# Patient Record
Sex: Female | Born: 1980 | Race: White | Hispanic: No | Smoking: Current every day smoker
Health system: Southern US, Community
[De-identification: ages and names within clinical notes are randomized; demographics above are authoritative.]

## PROBLEM LIST (undated history)

## (undated) DIAGNOSIS — F329 Major depressive disorder, single episode, unspecified: Secondary | ICD-10-CM

## (undated) DIAGNOSIS — E039 Hypothyroidism, unspecified: Secondary | ICD-10-CM

## (undated) DIAGNOSIS — R87619 Unspecified abnormal cytological findings in specimens from cervix uteri: Secondary | ICD-10-CM

## (undated) DIAGNOSIS — L609 Nail disorder, unspecified: Secondary | ICD-10-CM

## (undated) DIAGNOSIS — R0600 Dyspnea, unspecified: Secondary | ICD-10-CM

## (undated) DIAGNOSIS — T4145XA Adverse effect of unspecified anesthetic, initial encounter: Secondary | ICD-10-CM

## (undated) DIAGNOSIS — IMO0002 Reserved for concepts with insufficient information to code with codable children: Secondary | ICD-10-CM

## (undated) DIAGNOSIS — T8859XA Other complications of anesthesia, initial encounter: Secondary | ICD-10-CM

## (undated) DIAGNOSIS — F419 Anxiety disorder, unspecified: Secondary | ICD-10-CM

## (undated) DIAGNOSIS — Z8041 Family history of malignant neoplasm of ovary: Secondary | ICD-10-CM

## (undated) DIAGNOSIS — Z1371 Encounter for nonprocreative screening for genetic disease carrier status: Secondary | ICD-10-CM

## (undated) DIAGNOSIS — Z789 Other specified health status: Secondary | ICD-10-CM

## (undated) DIAGNOSIS — N898 Other specified noninflammatory disorders of vagina: Secondary | ICD-10-CM

## (undated) DIAGNOSIS — Z8619 Personal history of other infectious and parasitic diseases: Secondary | ICD-10-CM

## (undated) DIAGNOSIS — I10 Essential (primary) hypertension: Secondary | ICD-10-CM

## (undated) DIAGNOSIS — F32A Depression, unspecified: Secondary | ICD-10-CM

## (undated) DIAGNOSIS — Z9882 Breast implant status: Secondary | ICD-10-CM

## (undated) DIAGNOSIS — M5126 Other intervertebral disc displacement, lumbar region: Secondary | ICD-10-CM

## (undated) HISTORY — DX: Anxiety disorder, unspecified: F41.9

## (undated) HISTORY — DX: Hypothyroidism, unspecified: E03.9

## (undated) HISTORY — DX: Reserved for concepts with insufficient information to code with codable children: IMO0002

## (undated) HISTORY — DX: Breast implant status: Z98.82

## (undated) HISTORY — DX: Nail disorder, unspecified: L60.9

## (undated) HISTORY — DX: Other intervertebral disc displacement, lumbar region: M51.26

## (undated) HISTORY — DX: Major depressive disorder, single episode, unspecified: F32.9

## (undated) HISTORY — PX: COSMETIC SURGERY: SHX468

## (undated) HISTORY — DX: Dyspnea, unspecified: R06.00

## (undated) HISTORY — DX: Essential (primary) hypertension: I10

## (undated) HISTORY — DX: Personal history of other infectious and parasitic diseases: Z86.19

## (undated) HISTORY — DX: Unspecified abnormal cytological findings in specimens from cervix uteri: R87.619

## (undated) HISTORY — DX: Other specified noninflammatory disorders of vagina: N89.8

## (undated) HISTORY — DX: Family history of malignant neoplasm of ovary: Z80.41

---
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
## 2004-01-21 ENCOUNTER — Emergency Department: Payer: Self-pay | Admitting: Unknown Physician Specialty

## 2004-03-09 HISTORY — PX: LEEP: SHX91

## 2007-03-10 DIAGNOSIS — Z9882 Breast implant status: Secondary | ICD-10-CM

## 2007-03-10 HISTORY — PX: BREAST ENHANCEMENT SURGERY: SHX7

## 2007-03-10 HISTORY — DX: Breast implant status: Z98.82

## 2013-06-29 ENCOUNTER — Encounter: Payer: Self-pay | Admitting: Obstetrics & Gynecology

## 2013-06-30 ENCOUNTER — Encounter: Payer: Self-pay | Admitting: *Deleted

## 2013-07-28 ENCOUNTER — Emergency Department: Payer: Self-pay | Admitting: Emergency Medicine

## 2013-08-02 ENCOUNTER — Encounter: Payer: Self-pay | Admitting: General Practice

## 2013-08-02 ENCOUNTER — Ambulatory Visit (INDEPENDENT_AMBULATORY_CARE_PROVIDER_SITE_OTHER): Payer: BC Managed Care – PPO | Admitting: Obstetrics & Gynecology

## 2013-08-02 ENCOUNTER — Encounter: Payer: Self-pay | Admitting: Obstetrics & Gynecology

## 2013-08-02 VITALS — BP 105/73 | HR 78 | Temp 98.4°F | Ht 67.0 in | Wt 158.6 lb

## 2013-08-02 DIAGNOSIS — Z3009 Encounter for other general counseling and advice on contraception: Secondary | ICD-10-CM

## 2013-08-02 NOTE — Progress Notes (Signed)
Subjective:     Patient ID: Carol Castro, female   DOB: 04-Dec-1980, 33 y.o.   MRN: [id number]  HPI 33 yo WF [id number] (EABx5). Divorced/Single.  Presents for request for sterilization.  Pt reports that she will continue to terminate pregnancies without a sterilization.     Past Medical History  Diagnosis Date  . HPV test positive   . Abnormal Pap smear of cervix   . History of breast augmentation 2009   Past Surgical History  Procedure Laterality Date  . Leep  2006  . Cosmetic surgery    . Breast enhancement surgery  2009   History   Social History  . Marital Status: Single    Spouse Name: N/A    Number of Children: N/A  . Years of Education: N/A   Occupational History  . Not on file.   Social History Main Topics  . Smoking status: Current Every Day Smoker -- 0.50 packs/day    Types: Cigarettes  . Smokeless tobacco: Not on file  . Alcohol Use: No  . Drug Use: No  . Sexual Activity: Not Currently    Birth Control/ Protection: Injection   Other Topics Concern  . Not on file   Social History Narrative  . No narrative on file      Review of Systems     Objective:   Physical Exam BP 105/73  Pulse 78  Temp(Src) 98.4 F (36.9 C)  Ht 5\' 7"  (1.702 m)  Wt 158 lb 9.6 oz (71.94 kg)  BMI 24.83 kg/m2  LMP 07/31/2013 Exam deferred     Assessment:     Consult for sterilization- reviewed methods she wants to maintain her fallopian tubes because 'they are part of my body and I think they should be left in'.     Plan:     Patient desires surgical management with bilateral tubal ligation with Filshie clips.  The risks of surgery were discussed in detail with the patient including but not limited to: bleeding which may require transfusion or reoperation; infection which may require prolonged hospitalization or re-hospitalization and antibiotic therapy; injury to bowel, bladder, ureters and major vessels or other surrounding organs; need for additional procedures  including laparotomy; thromboembolic phenomenon, incisional problems and other postoperative or anesthesia complications.  Patient was told that the likelihood that her condition and symptoms will be treated effectively with this surgical management was very high; the postoperative expectations were also discussed in detail. The patient also understands the alternative treatment options which were discussed in full. All questions were answered.  She was told that she will be contacted by our surgical scheduler regarding the time and date of her surgery; routine preoperative instructions of having nothing to eat or drink after midnight on the day prior to surgery and also coming to the hospital 1 1/2 hours prior to her time of surgery were also emphasized.  She was told she may be called for a preoperative appointment about a week prior to surgery and will be given further preoperative instructions at that visit. Printed patient education handouts about the procedure were given to the patient to review at home.

## 2013-08-02 NOTE — Patient Instructions (Signed)
Laparoscopic Tubal Ligation  Laparoscopic tubal ligation is a procedure that closes the fallopian tubes at a time other than right after childbirth. By closing the fallopian tubes, the eggs that are released from the ovaries cannot enter the uterus and sperm cannot reach the egg. Tubal ligation is also known as getting your "tubes tied." Tubal ligation is done so you will not be able to get pregnant or have a baby.   Although this procedure may be reversed, it should be considered permanent and irreversible. If you want to have future pregnancies, you should not have this procedure.   LET YOUR CAREGIVER KNOW ABOUT:  · Allergies to food or medicine.  · Medicines taken, including vitamins, herbs, eyedrops, over-the-counter medicines, and creams.  · Use of steroids (by mouth or creams).  · Previous problems with numbing medicines.  · History of bleeding problems or blood clots.  · Any recent colds or infections.  · Previous surgery.  · Other health problems, including diabetes and kidney problems.  · Possibility of pregnancy, if this applies.  · Any past pregnancies.  RISKS AND COMPLICATIONS   · Infection.  · Bleeding.  · Injury to surrounding organs.  · Anesthetic side effects.  · Failure of the procedure.  · Ectopic pregnancy.  · Future regret about having the procedure done.  BEFORE THE PROCEDURE  · Do not take aspirin or blood thinners a week before the procedure or as directed. This can cause bleeding.  · Do not eat or drink anything 6 to 8 hours before the procedure.  PROCEDURE   · You may be given a medicine to help you relax (sedative) before the procedure. You will be given a medicine to make you sleep (general anesthetic) during the procedure.  · A tube will be put down your throat to help your breath while under general anesthesia.  · Two small cuts (incisions) are made in the lower abdominal area and near the belly button.  · Your abdominal area will be inflated with a safe gas (carbon dioxide). This helps  give the surgeon room to operate, visualize, and helps the surgeon avoid other organs.  · A thin, lighted tube (laparoscope) with a camera attached is inserted into your abdomen through one of the incisions near the belly button. Other small instruments are also inserted through the other abdominal incision.  · The fallopian tubes are located and are either blocked with a ring, clip, or are burned (cauterized).  · After the fallopian tubes are blocked, the gas is released from the abdomen.  · The incisions will be closed with stitches (sutures), and a bandage may be placed over the incisions.  AFTER THE PROCEDURE   · You will rest in a recovery room for 1 4 hours until you are stable and doing well.  · You will also have some mild abdominal discomfort for 3 7 days. You will be given pain medicine to ease any discomfort.  · As long as there are no problems, you may be allowed to go home. Someone will need to drive you home and be with you for at least 24 hours once home.  · You may have some mild discomfort in the throat. This is from the tube placed in your throat while you were sleeping.  · You may experience discomfort in the shoulder area from some trapped air between the liver and diaphragm. This sensation is normal and will slowly go away on its own.  Document Released: 06/01/2000 Document   Revised: 08/25/2011 Document Reviewed: 06/06/2011  ExitCare® Patient Information ©2014 [employer].

## 2013-08-03 ENCOUNTER — Encounter: Payer: Self-pay | Admitting: *Deleted

## 2013-08-23 ENCOUNTER — Encounter (HOSPITAL_COMMUNITY): Payer: Self-pay | Admitting: *Deleted

## 2013-08-28 ENCOUNTER — Ambulatory Visit (HOSPITAL_COMMUNITY): Payer: BC Managed Care – PPO | Admitting: Certified Registered Nurse Anesthetist

## 2013-08-28 ENCOUNTER — Encounter (HOSPITAL_COMMUNITY): Payer: BC Managed Care – PPO | Admitting: Certified Registered Nurse Anesthetist

## 2013-08-28 ENCOUNTER — Encounter (HOSPITAL_COMMUNITY)
Admission: RE | Disposition: A | Discharge status: Home / Self Care | Payer: Self-pay | Source: Ambulatory Visit | Attending: Obstetrics & Gynecology

## 2013-08-28 ENCOUNTER — Ambulatory Visit (HOSPITAL_COMMUNITY)
Admission: RE | Admit: 2013-08-28 | Discharge: 2013-08-28 | Disposition: A | Discharge status: Home / Self Care | Payer: BC Managed Care – PPO | Source: Ambulatory Visit | Attending: Obstetrics & Gynecology | Admitting: Obstetrics & Gynecology

## 2013-08-28 ENCOUNTER — Encounter (HOSPITAL_COMMUNITY): Payer: Self-pay

## 2013-08-28 DIAGNOSIS — F17200 Nicotine dependence, unspecified, uncomplicated: Secondary | ICD-10-CM | POA: Insufficient documentation

## 2013-08-28 DIAGNOSIS — Z302 Encounter for sterilization: Secondary | ICD-10-CM | POA: Diagnosis present

## 2013-08-28 DIAGNOSIS — Z978 Presence of other specified devices: Secondary | ICD-10-CM | POA: Insufficient documentation

## 2013-08-28 DIAGNOSIS — Z8041 Family history of malignant neoplasm of ovary: Secondary | ICD-10-CM | POA: Insufficient documentation

## 2013-08-28 DIAGNOSIS — N838 Other noninflammatory disorders of ovary, fallopian tube and broad ligament: Secondary | ICD-10-CM | POA: Diagnosis not present

## 2013-08-28 HISTORY — DX: Major depressive disorder, single episode, unspecified: F32.9

## 2013-08-28 HISTORY — PX: LAPAROSCOPIC BILATERAL SALPINGECTOMY: SHX5889

## 2013-08-28 HISTORY — DX: Other specified health status: Z78.9

## 2013-08-28 HISTORY — DX: Adverse effect of unspecified anesthetic, initial encounter: T41.45XA

## 2013-08-28 HISTORY — DX: Other complications of anesthesia, initial encounter: T88.59XA

## 2013-08-28 HISTORY — DX: Depression, unspecified: F32.A

## 2013-08-28 LAB — CBC
HCT: 43.2 % (ref 36.0–46.0)
HEMOGLOBIN: 14.3 g/dL (ref 12.0–15.0)
MCH: 29.7 pg (ref 26.0–34.0)
MCHC: 33.1 g/dL (ref 30.0–36.0)
MCV: 89.8 fL (ref 78.0–100.0)
Platelets: 206 10*3/uL (ref 150–400)
RBC: 4.81 MIL/uL (ref 3.87–5.11)
RDW: 13 % (ref 11.5–15.5)
WBC: 6.7 10*3/uL (ref 4.0–10.5)

## 2013-08-28 LAB — PREGNANCY, URINE: PREG TEST UR: NEGATIVE

## 2013-08-28 SURGERY — SALPINGECTOMY, BILATERAL, LAPAROSCOPIC
Anesthesia: General | Site: Abdomen | Laterality: Bilateral

## 2013-08-28 MED ORDER — NEOSTIGMINE METHYLSULFATE 10 MG/10ML IV SOLN
INTRAVENOUS | Status: AC
  Filled 2013-08-28: qty 1

## 2013-08-28 MED ORDER — LACTATED RINGERS IV SOLN
INTRAVENOUS | Status: DC
  Administered 2013-08-28 (×3): via INTRAVENOUS

## 2013-08-28 MED ORDER — ROCURONIUM BROMIDE 100 MG/10ML IV SOLN
INTRAVENOUS | Status: AC
  Filled 2013-08-28: qty 1

## 2013-08-28 MED ORDER — SODIUM CHLORIDE 0.9 % IJ SOLN
INTRAMUSCULAR | Status: AC
  Filled 2013-08-28: qty 10

## 2013-08-28 MED ORDER — KETOROLAC TROMETHAMINE 30 MG/ML IJ SOLN
INTRAMUSCULAR | Status: DC | PRN
  Administered 2013-08-28: 30 mg via INTRAVENOUS

## 2013-08-28 MED ORDER — GLYCOPYRROLATE 0.2 MG/ML IJ SOLN
INTRAMUSCULAR | Status: AC
  Filled 2013-08-28: qty 3

## 2013-08-28 MED ORDER — FENTANYL CITRATE 0.05 MG/ML IJ SOLN
INTRAMUSCULAR | Status: DC | PRN
  Administered 2013-08-28: 50 ug via INTRAVENOUS
  Administered 2013-08-28 (×2): 100 ug via INTRAVENOUS

## 2013-08-28 MED ORDER — DEXAMETHASONE SODIUM PHOSPHATE 10 MG/ML IJ SOLN
INTRAMUSCULAR | Status: AC
  Filled 2013-08-28: qty 1

## 2013-08-28 MED ORDER — LIDOCAINE HCL (CARDIAC) 20 MG/ML IV SOLN
INTRAVENOUS | Status: DC | PRN
  Administered 2013-08-28: 50 mg via INTRAVENOUS

## 2013-08-28 MED ORDER — MIDAZOLAM HCL 2 MG/2ML IJ SOLN
INTRAMUSCULAR | Status: AC
  Filled 2013-08-28: qty 2

## 2013-08-28 MED ORDER — MIDAZOLAM HCL 2 MG/2ML IJ SOLN
INTRAMUSCULAR | Status: DC | PRN
  Administered 2013-08-28: 2 mg via INTRAVENOUS

## 2013-08-28 MED ORDER — FENTANYL CITRATE 0.05 MG/ML IJ SOLN
INTRAMUSCULAR | Status: AC
  Filled 2013-08-28: qty 5

## 2013-08-28 MED ORDER — BUPIVACAINE HCL (PF) 0.5 % IJ SOLN
INTRAMUSCULAR | Status: AC
  Filled 2013-08-28: qty 30

## 2013-08-28 MED ORDER — OXYCODONE-ACETAMINOPHEN 5-325 MG PO TABS
1.0000 | ORAL_TABLET | Freq: Once | ORAL | Status: AC
  Administered 2013-08-28: 1 via ORAL

## 2013-08-28 MED ORDER — NEOSTIGMINE METHYLSULFATE 10 MG/10ML IV SOLN
INTRAVENOUS | Status: DC | PRN
  Administered 2013-08-28: 3 mg via INTRAVENOUS

## 2013-08-28 MED ORDER — ONDANSETRON HCL 4 MG/2ML IJ SOLN
INTRAMUSCULAR | Status: DC | PRN
  Administered 2013-08-28: 4 mg via INTRAVENOUS

## 2013-08-28 MED ORDER — FENTANYL CITRATE 0.05 MG/ML IJ SOLN
INTRAMUSCULAR | Status: AC
  Administered 2013-08-28: 50 ug via INTRAVENOUS
  Filled 2013-08-28: qty 2

## 2013-08-28 MED ORDER — ROCURONIUM BROMIDE 100 MG/10ML IV SOLN
INTRAVENOUS | Status: DC | PRN
  Administered 2013-08-28: 30 mg via INTRAVENOUS

## 2013-08-28 MED ORDER — FENTANYL CITRATE 0.05 MG/ML IJ SOLN
25.0000 ug | INTRAMUSCULAR | Status: DC | PRN
  Administered 2013-08-28 (×2): 50 ug via INTRAVENOUS

## 2013-08-28 MED ORDER — PROPOFOL 10 MG/ML IV EMUL
INTRAVENOUS | Status: AC
  Filled 2013-08-28: qty 20

## 2013-08-28 MED ORDER — MEPERIDINE HCL 25 MG/ML IJ SOLN: 6.2500 mg | INTRAMUSCULAR | Status: DC | PRN

## 2013-08-28 MED ORDER — LIDOCAINE HCL (CARDIAC) 20 MG/ML IV SOLN
INTRAVENOUS | Status: AC
  Filled 2013-08-28: qty 5

## 2013-08-28 MED ORDER — IBUPROFEN 600 MG PO TABS: 600.0000 mg | ORAL_TABLET | Freq: Four times a day (QID) | ORAL | Status: DC | PRN

## 2013-08-28 MED ORDER — PROPOFOL 10 MG/ML IV BOLUS
INTRAVENOUS | Status: DC | PRN
  Administered 2013-08-28: 160 mg via INTRAVENOUS

## 2013-08-28 MED ORDER — DEXAMETHASONE SODIUM PHOSPHATE 10 MG/ML IJ SOLN
INTRAMUSCULAR | Status: DC | PRN
  Administered 2013-08-28: 10 mg via INTRAVENOUS

## 2013-08-28 MED ORDER — OXYCODONE-ACETAMINOPHEN 5-325 MG PO TABS: 1.0000 | ORAL_TABLET | Freq: Four times a day (QID) | ORAL | Status: DC | PRN

## 2013-08-28 MED ORDER — OXYCODONE-ACETAMINOPHEN 5-325 MG PO TABS
ORAL_TABLET | ORAL | Status: AC
  Filled 2013-08-28: qty 1

## 2013-08-28 MED ORDER — MIDAZOLAM HCL 2 MG/2ML IJ SOLN: 0.5000 mg | Freq: Once | INTRAMUSCULAR | Status: DC | PRN

## 2013-08-28 MED ORDER — BUPIVACAINE HCL 0.5 % IJ SOLN
INTRAMUSCULAR | Status: DC | PRN
Start: 2013-08-28 — End: 2013-08-28
  Administered 2013-08-28: 30 mL

## 2013-08-28 MED ORDER — PROMETHAZINE HCL 25 MG/ML IJ SOLN: 6.2500 mg | INTRAMUSCULAR | Status: DC | PRN

## 2013-08-28 MED ORDER — ONDANSETRON HCL 4 MG/2ML IJ SOLN
INTRAMUSCULAR | Status: AC
  Filled 2013-08-28: qty 2

## 2013-08-28 MED ORDER — KETOROLAC TROMETHAMINE 30 MG/ML IJ SOLN
INTRAMUSCULAR | Status: AC
  Filled 2013-08-28: qty 1

## 2013-08-28 MED ORDER — GLYCOPYRROLATE 0.2 MG/ML IJ SOLN
INTRAMUSCULAR | Status: DC | PRN
  Administered 2013-08-28: 0.4 mg via INTRAVENOUS
  Administered 2013-08-28: 0.2 mg via INTRAVENOUS

## 2013-08-28 MED ORDER — KETOROLAC TROMETHAMINE 30 MG/ML IJ SOLN: 15.0000 mg | Freq: Once | INTRAMUSCULAR | Status: DC | PRN

## 2013-08-28 SURGICAL SUPPLY — 25 items
BENZOIN TINCTURE PRP APPL 2/3 (GAUZE/BANDAGES/DRESSINGS) ×3 IMPLANT
CATH ROBINSON RED A/P 16FR (CATHETERS) ×3 IMPLANT
CHLORAPREP W/TINT 26ML (MISCELLANEOUS) ×3 IMPLANT
CLOSURE WOUND 1/4X4 (GAUZE/BANDAGES/DRESSINGS) ×1
CLOTH BEACON ORANGE TIMEOUT ST (SAFETY) ×3 IMPLANT
DERMABOND ADVANCED (GAUZE/BANDAGES/DRESSINGS) ×2
DERMABOND ADVANCED .7 DNX12 (GAUZE/BANDAGES/DRESSINGS) ×1 IMPLANT
DRSG COVADERM PLUS 2X2 (GAUZE/BANDAGES/DRESSINGS) ×3 IMPLANT
DRSG OPSITE POSTOP 3X4 (GAUZE/BANDAGES/DRESSINGS) ×3 IMPLANT
GLOVE BIO SURGEON STRL SZ7 (GLOVE) ×3 IMPLANT
GLOVE BIOGEL PI IND STRL 7.0 (GLOVE) ×1 IMPLANT
GLOVE BIOGEL PI INDICATOR 7.0 (GLOVE) ×2
GOWN STRL REUS W/TWL LRG LVL3 (GOWN DISPOSABLE) ×6 IMPLANT
MANIPULATOR UTERINE 4.5 ZUMI (MISCELLANEOUS) ×3 IMPLANT
NEEDLE INSUFFLATION 120MM (ENDOMECHANICALS) ×3 IMPLANT
PACK LAPAROSCOPY BASIN (CUSTOM PROCEDURE TRAY) ×3 IMPLANT
STRIP CLOSURE SKIN 1/4X4 (GAUZE/BANDAGES/DRESSINGS) ×2 IMPLANT
SUT VIC AB 3-0 PS2 18 (SUTURE) ×3 IMPLANT
SUT VICRYL 0 UR6 27IN ABS (SUTURE) ×3 IMPLANT
TOWEL OR 17X24 6PK STRL BLUE (TOWEL DISPOSABLE) ×6 IMPLANT
TROCAR OPTI TIP 5M 100M (ENDOMECHANICALS) ×3 IMPLANT
TROCAR XCEL NON BLADE 8MM B8LT (ENDOMECHANICALS) ×3 IMPLANT
TROCAR XCEL OPT SLVE 5M 100M (ENDOMECHANICALS) ×3 IMPLANT
WARMER LAPAROSCOPE (MISCELLANEOUS) ×3 IMPLANT
WATER STERILE IRR 1000ML POUR (IV SOLUTION) ×3 IMPLANT

## 2013-08-28 NOTE — Transfer of Care (Signed)
Immediate Anesthesia Transfer of Care Note  Patient: Carol DuffHeather E Sharrar  Procedure(s) Performed: Procedure(s): LAPAROSCOPIC BILATERAL SALPINGECTOMY (Bilateral)  Patient Location: PACU  Anesthesia Type:General  Level of Consciousness: awake, alert  and oriented  Airway & Oxygen Therapy: Patient Spontanous Breathing and Patient connected to nasal cannula oxygen  Post-op Assessment: Report given to PACU RN, Post -op Vital signs reviewed and stable and Patient moving all extremities X 4  Post vital signs: Reviewed and stable  Complications: No apparent anesthesia complications

## 2013-08-28 NOTE — Anesthesia Postprocedure Evaluation (Signed)
  Anesthesia Post Note  Patient: Carol DuffHeather E Lagace  Procedure(s) Performed: Procedure(s) (LRB): LAPAROSCOPIC BILATERAL SALPINGECTOMY (Bilateral)  Anesthesia type: GA  Patient location: PACU  Post pain: Pain level controlled  Post assessment: Post-op Vital signs reviewed  Last Vitals:  Filed Vitals:   08/28/13 1115  BP:   Temp: 36.6 C  Resp: 16    Post vital signs: Reviewed  Level of consciousness: sedated  Complications: No apparent anesthesia complications

## 2013-08-28 NOTE — Discharge Instructions (Signed)
Salpingectomy, Care After °Refer to this sheet in the next few weeks. These instructions provide you with information on caring for yourself after your procedure. Your health care provider may also give you more specific instructions. Your treatment has been planned according to current medical practices, but problems sometimes occur. Call your health care provider if you have any problems or questions after your procedure. °WHAT TO EXPECT AFTER THE PROCEDURE °After your procedure, it is typical to have the following: °· Abdominal pain that can be controlled with pain medicine. °· Vaginal spotting. °· Tiredness. °HOME CARE INSTRUCTIONS °· Get plenty of rest and sleep. °· Only take over-the-counter or prescription medicines as directed by your health care provider. °· Keep incision areas clean and dry. Remove or change any bandages (dressings) only as directed by your health care provider. °· You may resume your regular diet. Eat a well-balanced diet. °· Drink enough fluids to keep your urine clear or pale yellow. °· Limit exercise and activities as directed by your health care provider. Do not lift anything heavier than 5 lb (2.3 kg) until your health care provider approves. °· Do not drive until your health care provider approves. °· Do not have sexual intercourse until your health care provider says it is okay. °· Take your temperature twice a day for the first week. Write those temperatures down. °· Follow up with your health care provider as directed. °SEEK MEDICAL CARE IF: °· You have pain when you urinate. °· You see pus coming out of the incision, or the incision is separating. °· You have increasing abdominal pain. °· You have swelling or redness in the incision area. °· You develop a rash. °· You feel lightheaded. °· You have pain that is not controlled with medicine. °SEEK IMMEDIATE MEDICAL CARE IF: °· You develop a fever. °· You have increasing abdominal pain. °· You develop chest or leg pain. °· You  develop shortness of breath. °· You pass out. °Document Released: 05/30/2010 Document Revised: 12/14/2012 Document Reviewed: 08/17/2012 °ExitCare® Patient Information ©2015 [employer]. This information is not intended to replace advice given to you by your health care provider. Make sure you discuss any questions you have with your health care provider. ° °

## 2013-08-28 NOTE — H&P (Signed)
  HPI 33 yo WF [id number] (EABx5). Divorced/Single. Presents for request for sterilization. Pt reports that she will continue to terminate pregnancies without a sterilization.  Past Medical History   Diagnosis  Date   .  HPV test positive    .  Abnormal Pap smear of cervix    .  History of breast augmentation  2009    Past Surgical History   Procedure  Laterality  Date   .  Leep   2006   .  Cosmetic surgery     .  Breast enhancement surgery   2009    History    Social History   .  Marital Status:  Single     Spouse Name:  N/A     Number of Children:  N/A   .  Years of Education:  N/A    Occupational History   .  Not on file.    Social History Main Topics   .  Smoking status:  Current Every Day Smoker -- 0.50 packs/day     Types:  Cigarettes   .  Smokeless tobacco:  Not on file   .  Alcohol Use:  No   .  Drug Use:  No   .  Sexual Activity:  Not Currently     Birth Control/ Protection:  Injection    Other Topics  Concern   .  Not on file    Social History Narrative   .  No narrative on file   Review of Systems  Objective:   Physical Exam BP 98/72  Temp(Src) 98.4 F (36.9 C) (Oral)  Resp 18  Ht 5\' 7"  (1.702 m)  Wt 160 lb (72.576 kg)  BMI 25.05 kg/m2  SpO2 98%  LMP 07/31/2013  Exam deferred  Assessment:   Pt presents for bilateral salpingectomy for sterilization.  After reviewing the info of an aunt with ovarian cancer she desires a salpingectomy.  Her mother was present for the discussion.  Plan:   Patient desires surgical management with bilateral tubal ligation with Filshie clips. The risks of surgery were discussed in detail with the patient including but not limited to: bleeding which may require transfusion or reoperation; infection which may require prolonged hospitalization or re-hospitalization and antibiotic therapy; injury to bowel, bladder, ureters and major vessels or other surrounding organs; need for additional procedures including laparotomy;  thromboembolic phenomenon, incisional problems and other postoperative or anesthesia complications. Patient was told that the likelihood that her condition and symptoms will be treated effectively with this surgical management was very high; the postoperative expectations were also discussed in detail. The patient also understands the alternative treatment options which were discussed in full. All questions were answered.

## 2013-08-28 NOTE — Anesthesia Preprocedure Evaluation (Addendum)
Anesthesia Evaluation  Patient identified by MRN, date of birth, ID band Patient awake    Reviewed: Allergy & Precautions, H&P , Patient's Chart, lab work & pertinent test results, reviewed documented beta blocker date and time   History of Anesthesia Complications Negative for: history of anesthetic complications  Airway Mallampati: II  TM Distance: >3 FB Neck ROM: full    Dental   Pulmonary Current Smoker,  breath sounds clear to auscultation        Cardiovascular Exercise Tolerance: Good Rhythm:regular Rate:Normal     Neuro/Psych    GI/Hepatic   Endo/Other    Renal/GU      Musculoskeletal   Abdominal   Peds  Hematology   Anesthesia Other Findings   Reproductive/Obstetrics                             Anesthesia Physical Anesthesia Plan  ASA: II  Anesthesia Plan: General ETT   Post-op Pain Management:    Induction:   Airway Management Planned:   Additional Equipment:   Intra-op Plan:   Post-operative Plan:   Informed Consent: I have reviewed the patients History and Physical, chart, labs and discussed the procedure including the risks, benefits and alternatives for the proposed anesthesia with the patient or authorized representative who has indicated his/her understanding and acceptance.   Dental Advisory Given  Plan Discussed with: CRNA and Surgeon  Anesthesia Plan Comments:         Anesthesia Quick Evaluation  

## 2013-08-28 NOTE — Brief Op Note (Signed)
08/28/2013  11:01 AM  PATIENT:  Carol Castro  33 y.o. female  PRE-OPERATIVE DIAGNOSIS:  Undesired Fertility  POST-OPERATIVE DIAGNOSIS:  Undesired Fertility  PROCEDURE:  Procedure(s): LAPAROSCOPIC BILATERAL SALPINGECTOMY (Bilateral)  SURGEON:  Surgeon(s) and Role:    * Willodean Rosenthalarolyn Harraway-Smith, MD - Primary  ANESTHESIA:   general  EBL:  Total I/O In: 1000 [I.V.:1000] Out: 100 [Urine:100]  BLOOD ADMINISTERED:none  DRAINS: none   LOCAL MEDICATIONS USED:  MARCAINE     SPECIMEN:  Source of Specimen:  fallopian tubes bilaterally  DISPOSITION OF SPECIMEN:  PATHOLOGY  COUNTS:  YES  TOURNIQUET:  * No tourniquets in log *  DICTATION: .Note written in EPIC  PLAN OF CARE: Discharge to home after PACU  PATIENT DISPOSITION:  PACU - hemodynamically stable.   Delay start of Pharmacological VTE agent (>24hrs) due to surgical blood loss or risk of bleeding: not applicable

## 2013-08-28 NOTE — Op Note (Signed)
08/28/2013  11:01 AM  PATIENT:  Carol Castro  33 y.o. female  PRE-OPERATIVE DIAGNOSIS:  Undesired Fertility  POST-OPERATIVE DIAGNOSIS:  Undesired Fertility  PROCEDURE:  Procedure(s): LAPAROSCOPIC BILATERAL SALPINGECTOMY (Bilateral)  SURGEON:  Surgeon(s) and Role:    * Willodean Rosenthalarolyn Harraway-Smith, MD - Primary  ANESTHESIA:   general  EBL:  Total I/O In: 1000 [I.V.:1000] Out: 100 [Urine:100]  BLOOD ADMINISTERED:none  DRAINS: none   LOCAL MEDICATIONS USED:  MARCAINE     SPECIMEN:  Source of Specimen:  fallopian tubes bilaterally  DISPOSITION OF SPECIMEN:  PATHOLOGY  COUNTS:  YES  TOURNIQUET:  * No tourniquets in log *  DICTATION: .Note written in EPIC  PLAN OF CARE: Discharge to home after PACU  PATIENT DISPOSITION:  PACU - hemodynamically stable.   Delay start of Pharmacological VTE agent (>24hrs) due to surgical blood loss or risk of bleeding: not applicable    IINDICATIONS: 33 y.o. [id number] with undesired fertility, desires permanent sterilization. Other reversible forms of contraception were discussed with patient; she declines all other modalities.  Risks of procedure discussed with patient including permanence of method, risk of regret, bleeding, infection, injury to surrounding organs and need for additional procedures including laparotomy.  Failure risk less than 0.5% with increased risk of ectopic gestation if pregnancy occurs was also discussed with patient.  Written informed consent was obtained.    FINDINGS:  Normal uterus, fallopian tubes, and ovaries.  TECHNIQUE:  The patient was taken to the operating room where general anesthesia was obtained without difficulty.  She was then placed in the dorsal lithotomy position and prepared and draped in sterile fashion.  After an adequate timeout was performed, a bivalved speculum was then placed in the patient's vagina, and the anterior lip of cervix grasped with the single-tooth tenaculum.  The uterine manipulator  was then advanced into the uterus.  The speculum was removed from the vagina.  Attention was then turned to the patient's abdomen where a 5-mm skin incision was made in the umbilical fold.  The 5-mm trocar and sleeve were then advanced without difficulty with the laparoscope under direct visualization into the abdomen.  The abdomen was then insufflated with carbon dioxide gas.  Adequate pneumoperitoneum was obtained.  A survey of the patient's pelvis and abdomen revealed the findings above.  Bilateral 5-mm lower quadrant ports  were then placed under direct visualization.  The fallopian tubes were transected from the uterine attachments and the underlying mesosalpinx with the Harmonic device allowing for bilateral salpingectomy.  The fallopian tubes were then removed from the abdomen under direct visualization.  The operative site was surveyed, and it was found to be hemostatic.   No intraoperative injury to other surrounding organs was noted.  The abdomen was desufflated and all instruments were then removed from the patient's abdomen.  All skin incisions were closed with Dermabond and benzoin and steristrips. They were also covered with steristrips and benzoin .  The uterine manipulator was removed from the vagina without complications. The patient tolerated the procedure well.  Sponge, lap, and needle counts were correct times two.  The patient was then taken to the recovery room awake, extubated and in stable condition.  The patient will be discharged to home as per PACU criteria.  Routine postoperative instructions given.  She was prescribed Percocet and Motrin.  She will follow up in the clinic in 2 weeks for postoperative evaluation.

## 2013-08-29 ENCOUNTER — Encounter (HOSPITAL_COMMUNITY): Payer: Self-pay | Admitting: Obstetrics & Gynecology

## 2013-09-06 ENCOUNTER — Encounter (HOSPITAL_BASED_OUTPATIENT_CLINIC_OR_DEPARTMENT_OTHER): Admission: RE | Payer: Self-pay | Source: Ambulatory Visit

## 2013-09-06 ENCOUNTER — Ambulatory Visit (HOSPITAL_BASED_OUTPATIENT_CLINIC_OR_DEPARTMENT_OTHER)
Admission: RE | Admit: 2013-09-06 | Payer: Medicaid Other | Source: Ambulatory Visit | Admitting: Obstetrics & Gynecology

## 2013-09-06 SURGERY — LIGATION, FALLOPIAN TUBE, BILATERAL
Anesthesia: Choice

## 2013-10-16 ENCOUNTER — Encounter (HOSPITAL_COMMUNITY): Admission: RE | Payer: Self-pay | Source: Ambulatory Visit

## 2013-10-16 ENCOUNTER — Ambulatory Visit (HOSPITAL_COMMUNITY)
Admission: RE | Admit: 2013-10-16 | Payer: Medicaid Other | Source: Ambulatory Visit | Admitting: Obstetrics & Gynecology

## 2013-10-16 SURGERY — LIGATION, FALLOPIAN TUBE, LAPAROSCOPIC
Anesthesia: Choice | Site: Abdomen | Laterality: Bilateral

## 2013-12-05 ENCOUNTER — Telehealth: Payer: Self-pay | Admitting: *Deleted

## 2013-12-05 NOTE — Telephone Encounter (Signed)
2nd attempt to reschedule 10/1 office visit. Patient was called on 9/28 and again 9/29.

## 2013-12-07 ENCOUNTER — Ambulatory Visit: Payer: BC Managed Care – PPO | Admitting: Neurology

## 2014-01-08 ENCOUNTER — Encounter (HOSPITAL_COMMUNITY): Payer: Self-pay | Admitting: Obstetrics & Gynecology

## 2014-02-16 DIAGNOSIS — E039 Hypothyroidism, unspecified: Secondary | ICD-10-CM | POA: Insufficient documentation

## 2014-02-16 DIAGNOSIS — I10 Essential (primary) hypertension: Secondary | ICD-10-CM | POA: Insufficient documentation

## 2014-02-16 DIAGNOSIS — E119 Type 2 diabetes mellitus without complications: Secondary | ICD-10-CM | POA: Insufficient documentation

## 2014-02-16 HISTORY — DX: Essential (primary) hypertension: I10

## 2014-02-16 HISTORY — DX: Hypothyroidism, unspecified: E03.9

## 2014-06-05 ENCOUNTER — Ambulatory Visit: Payer: Self-pay | Admitting: Obstetrics and Gynecology

## 2014-06-12 ENCOUNTER — Ambulatory Visit
Admit: 2014-06-12 | Disposition: A | Discharge status: Home / Self Care | Payer: Self-pay | Attending: Obstetrics and Gynecology | Admitting: Obstetrics and Gynecology

## 2014-07-08 NOTE — Op Note (Signed)
PATIENT NAME:  Carol Castro, Carol Castro MR#:  [id number] DATE OF BIRTH:  [birth year]  DATE OF PROCEDURE:  06/12/2014  ATTENDING/DICTATING:  [place] Bingharlie Alante Tolan, MD  PREOPERATIVE DIAGNOSIS:  Chronic pelvic pain.   POSTOPERATIVE DIAGNOSIS:  Chronic pelvic pain.   PROCEDURE: Diagnostic laparoscopy.   SURGEON: [place] Bingharlie Alaa Mullally, MD   ASSISTANT: None.   ANESTHESIA: General and local.   ESTIMATED BLOOD LOSS: 10 mL.   FLUIDS: 700 mL crystalloid.   URINE OUTPUT: 50 mL via in and out catheter.   ANTIBIOTICS: None indicated.   PTE THROMBOEMBOLISM PROPHYLAXIS: SCDs applied to bilateral lower extremities.   SPECIMENS: None.   COMPLICATIONS: None.   DISPOSITION: To PACU.   FINDINGS: Exam under anesthesia revealed a retroverted, mobile, small uterus, and no adnexal masses palpated. The uterus sounded to 11 cm.  On diagnostic laparoscopy, normal liver edge,  stomach edge, omentum. No adhesions were noted anywhere in the abdomen or the pelvis and  normal-appearing appendix. Normal uterus, and ovaries bilaterally with no bilateral fallopian tubes from prior surgeries.   In the bilateral paraovarian fossas as well as the anterior and posterior cul-de-sacs, there were no scar tissue signs of endometriosis scarring or adhesions.   DESCRIPTION OF PROCEDURE: The patient was taken to the operating room where the anesthesia was administered. She was then prepped and draped in normal sterile fashion in  dorsal lithotomy position in [place] stirrups.   Next, her bladder was then drained via a red rubber catheter and a speculum was then inserted and the cervix visualized and grasped on the anterior lip by single-tooth tenaculum and sounded to 11 cm and a Hulka clamp was then placed and the tenaculum and speculum were then removed. Next, gloves were then changed and attention was then turned to the patient's abdomen where the umbilicus was then infiltrated with local anesthesia and a 12 mm balloon trocar was then placed  using opened entry technique. The laparoscope was then introduced, and the abdomen insufflated with the above-noted findings. Next, a 5 mm suprapubic port was then placed under direct visualization for better manipulation with the above noted findings. Next, the 5 mm port was then removed under direct visualization and the balloon trocar and laparoscope were removed from the umbilicus. The umbilical fascia was then closed with 0 Vicryl in a running fashion and the skin incision closed with 4-0 Monocryl and the skin incision closed at the suprapubic port closed with Dermabond with Dermabond also placed over the umbilical port. The Hulka was then removed from the patient's cervix and the speculum reintroduced with excellent  hemostasis noted from all operative sites. Sponge, lap, needle, and instrument counts were correct x2 and the patient was taken to the recovery room, awake, alert, and breathing independently in stable condition.     ____________________________ [place] Bingharlie Yamilet Mcfayden, MD cp:tr D: 06/12/2014 10:42:03 ET T: 06/12/2014 11:12:00 ET JOB#: [id number]  cc: [place] Bingharlie Vernel Langenderfer, MD, <Dictator> [place] BingHARLIE Javae Braaten MD ELECTRONICALLY SIGNED 06/21/2014 10:22

## 2014-07-27 ENCOUNTER — Encounter (INDEPENDENT_AMBULATORY_CARE_PROVIDER_SITE_OTHER): Payer: Self-pay

## 2014-07-27 ENCOUNTER — Encounter: Payer: Self-pay | Admitting: Primary Care

## 2014-07-27 ENCOUNTER — Ambulatory Visit (INDEPENDENT_AMBULATORY_CARE_PROVIDER_SITE_OTHER): Payer: 59 | Admitting: Primary Care

## 2014-07-27 VITALS — BP 114/78 | HR 77 | Temp 98.3°F | Ht 67.0 in | Wt 171.2 lb

## 2014-07-27 DIAGNOSIS — F329 Major depressive disorder, single episode, unspecified: Secondary | ICD-10-CM

## 2014-07-27 DIAGNOSIS — R51 Headache: Secondary | ICD-10-CM | POA: Diagnosis not present

## 2014-07-27 DIAGNOSIS — F418 Other specified anxiety disorders: Secondary | ICD-10-CM | POA: Diagnosis not present

## 2014-07-27 DIAGNOSIS — J329 Chronic sinusitis, unspecified: Secondary | ICD-10-CM | POA: Diagnosis not present

## 2014-07-27 DIAGNOSIS — R519 Headache, unspecified: Secondary | ICD-10-CM

## 2014-07-27 DIAGNOSIS — L700 Acne vulgaris: Secondary | ICD-10-CM | POA: Diagnosis not present

## 2014-07-27 DIAGNOSIS — R635 Abnormal weight gain: Secondary | ICD-10-CM

## 2014-07-27 DIAGNOSIS — F419 Anxiety disorder, unspecified: Principal | ICD-10-CM

## 2014-07-27 DIAGNOSIS — K589 Irritable bowel syndrome without diarrhea: Secondary | ICD-10-CM

## 2014-07-27 DIAGNOSIS — F32A Depression, unspecified: Secondary | ICD-10-CM

## 2014-07-27 MED ORDER — FLUTICASONE PROPIONATE 50 MCG/ACT NA SUSP: 2.0000 | Freq: Every day | NASAL | Status: DC

## 2014-07-27 MED ORDER — FLUOXETINE HCL 20 MG PO TABS: 20.0000 mg | ORAL_TABLET | Freq: Every day | ORAL | Status: DC

## 2014-07-27 MED ORDER — BUPROPION HCL ER (SR) 150 MG PO TB12: 150.0000 mg | ORAL_TABLET | Freq: Two times a day (BID) | ORAL | Status: DC

## 2014-07-27 MED ORDER — AMOXICILLIN-POT CLAVULANATE 875-125 MG PO TABS: 1.0000 | ORAL_TABLET | Freq: Two times a day (BID) | ORAL | Status: DC

## 2014-07-27 NOTE — Progress Notes (Signed)
Pre visit review using our clinic review tool, if applicable. No additional management support is needed unless otherwise documented below in the visit note. 

## 2014-07-27 NOTE — Progress Notes (Signed)
Subjective:    Patient ID: Carol Castro, female    DOB: 1980/10/18, 34 y.o.   MRN: [id number]  HPI  [name] is a 34 year old female who presents today to establish care and discuss the problems mentioned below. Will obtain old records.  1) Anxiety and Depression: Diagnosed in her early 20's. She's been taking Wellbutrin SR 150 mg and Fluoxitine 20 mg daily since. She is out of medication for both and is needing refills. She feels well managed on these medications. She has once tried to wean off of these medications but was not successful. Denies SI/HI, panic attacks.   2) Frequent headaches: Present for years, once had migraines. Present to bilateral temporal region that is present every other day. She will typically take excedrin or caffiene with some relief. Headaches are tolerable at this point.  3) IBS Mixed type: Provided with Diclomine QID with food which did not provide relief. She will get stomach cramps once-twice weekly. Denies bloody stools, nausea, vomiting.  4) Sinus pressure: Present for 2-3 weeks with pressure behind both eyes without improvement. She also reports nasal congestion, cough, ear pressure that has worsened over past week. Taking daily Allegra daily for 2 weeks and dayquil. She has not taken any ibuprofen or tylenol.   5) Weight gain: Has had full work up by prior PCP. She has gained 30 pounds in the past 2 years. She took Phentermine 37.5 for 3 months without much loss. She had work up for thyroid, A1C, allergy testing which were all normal per patient.  Weight: 171 lb 3.2 oz (77.656 kg)    Review of Systems  Constitutional: Negative for fever, chills and unexpected weight change.  HENT: Positive for congestion and sneezing. Negative for sore throat.        Ear pressure.  Respiratory: Negative for shortness of breath.   Cardiovascular: Negative for chest pain.  Gastrointestinal: Negative for nausea.       History of IBS, mixed type.    Genitourinary: Positive for frequency. Negative for dysuria.  Musculoskeletal: Negative for myalgias and arthralgias.  Skin: Negative for rash.  Neurological: Positive for headaches. Negative for dizziness.  Psychiatric/Behavioral:       See HPI.       Past Medical History  Diagnosis Date  . HPV test positive   . Abnormal Pap smear of cervix   . History of breast augmentation 2009  . Medical history non-contributory   . Depression   . Complication of anesthesia     History   Social History  . Marital Status: Divorced    Spouse Name: N/A  . Number of Children: N/A  . Years of Education: N/A   Occupational History  . Not on file.   Social History Main Topics  . Smoking status: Current Every Day Smoker -- 0.50 packs/day    Types: Cigarettes  . Smokeless tobacco: Not on file  . Alcohol Use: No  . Drug Use: No  . Sexual Activity: Not Currently    Birth Control/ Protection: Injection   Other Topics Concern  . Not on file   Social History Narrative    Past Surgical History  Procedure Laterality Date  . Leep  2006  . Cosmetic surgery    . Breast enhancement surgery  2009  . Laparoscopic bilateral salpingectomy Bilateral 08/28/2013    Procedure: LAPAROSCOPIC BILATERAL SALPINGECTOMY;  Surgeon: Willodean Rosenthalarolyn Harraway-Smith, MD;  Location: [facility];  Service: Gynecology;  Laterality: Bilateral;    Family History  Problem Relation Age of Onset  . Alcohol abuse Father   . Cancer Maternal Aunt   . Cancer Maternal Grandfather     No Known Allergies  No current outpatient prescriptions on file prior to visit.   No current facility-administered medications on file prior to visit.    BP 114/78 mmHg  Pulse 77  Temp(Src) 98.3 F (36.8 C) (Oral)  Ht 5\' 7"  (1.702 m)  Wt 171 lb 3.2 oz (77.656 kg)  BMI 26.81 kg/m2  SpO2 98%  LMP 07/27/2014    Objective:   Physical Exam  Constitutional: She is oriented to person, place, and time. She appears ill.  HENT:  Right Ear:  Tympanic membrane and ear canal normal.  Left Ear: Tympanic membrane and ear canal normal.  Nose: Right sinus exhibits maxillary sinus tenderness and frontal sinus tenderness. Left sinus exhibits maxillary sinus tenderness and frontal sinus tenderness.  Mouth/Throat: Oropharynx is clear and moist.  Eyes: Conjunctivae and EOM are normal. Pupils are equal, round, and reactive to light.  Neck: Neck supple. No thyromegaly present.  Cardiovascular: Normal rate and regular rhythm.   Pulmonary/Chest: Effort normal and breath sounds normal.  Abdominal: Soft. Bowel sounds are normal.  Lymphadenopathy:    She has no cervical adenopathy.  Neurological: She is alert and oriented to person, place, and time. No cranial nerve deficit.  Skin: Skin is warm and dry.  Psychiatric: She has a normal mood and affect.          Assessment & Plan:  Sinusitis:  Frontal and maxillary sinus pressure present for 2-3 weeks with nasal congestion, ear pain and cough over past week. Taking daily Allegra. Sinus cavities tender on exam. RX for 10 day course of Augmentin. Flonase for nasal congestion. Follow up if no improvement in 3-4 days.

## 2014-07-27 NOTE — Patient Instructions (Signed)
Start Augmentin antibiotics for sinusitis. Take 1 tablet by mouth twice daily for 10 days. You may use Flonase nasal spray for nasal congestion. You will be contacted regarding your referral to Dermatology.  Please let us know if you have not heard back within one week.  Refills have been provided for your prozac and Wellbutrin. Please schedule a physical with me in the next 2 months. You will also schedule a lab only appointment one week prior. We will discuss your lab results during your physical. It was a pleasure to meet you today! Please don't hesitate to call me with any questions. Welcome to [facility]!  Sinusitis Sinusitis is redness, soreness, and inflammation of the paranasal sinuses. Paranasal sinuses are air pockets within the bones of your face (beneath the eyes, the middle of the forehead, or above the eyes). In healthy paranasal sinuses, mucus is able to drain out, and air is able to circulate through them by way of your nose. However, when your paranasal sinuses are inflamed, mucus and air can become trapped. This can allow bacteria and other germs to grow and cause infection. Sinusitis can develop quickly and last only a short time (acute) or continue over a long period (chronic). Sinusitis that lasts for more than 12 weeks is considered chronic.  CAUSES  Causes of sinusitis include:  Allergies.  Structural abnormalities, such as displacement of the cartilage that separates your nostrils (deviated septum), which can decrease the air flow through your nose and sinuses and affect sinus drainage.  Functional abnormalities, such as when the small hairs (cilia) that line your sinuses and help remove mucus do not work properly or are not present. SIGNS AND SYMPTOMS  Symptoms of acute and chronic sinusitis are the same. The primary symptoms are pain and pressure around the affected sinuses. Other symptoms include:  Upper toothache.  Earache.  Headache.  Bad breath.  Decreased  sense of smell and taste.  A cough, which worsens when you are lying flat.  Fatigue.  Fever.  Thick drainage from your nose, which often is green and may contain pus (purulent).  Swelling and warmth over the affected sinuses. DIAGNOSIS  Your health care provider will perform a physical exam. During the exam, your health care provider may:  Look in your nose for signs of abnormal growths in your nostrils (nasal polyps).  Tap over the affected sinus to check for signs of infection.  View the inside of your sinuses (endoscopy) using an imaging device that has a light attached (endoscope). If your health care provider suspects that you have chronic sinusitis, one or more of the following tests may be recommended:  Allergy tests.  Nasal culture. A sample of mucus is taken from your nose, sent to a lab, and screened for bacteria.  Nasal cytology. A sample of mucus is taken from your nose and examined by your health care provider to determine if your sinusitis is related to an allergy. TREATMENT  Most cases of acute sinusitis are related to a viral infection and will resolve on their own within 10 days. Sometimes medicines are prescribed to help relieve symptoms (pain medicine, decongestants, nasal steroid sprays, or saline sprays).  However, for sinusitis related to a bacterial infection, your health care provider will prescribe antibiotic medicines. These are medicines that will help kill the bacteria causing the infection.  Rarely, sinusitis is caused by a fungal infection. In theses cases, your health care provider will prescribe antifungal medicine. For some cases of chronic sinusitis, surgery is  needed. Generally, these are cases in which sinusitis recurs more than 3 times per year, despite other treatments. HOME CARE INSTRUCTIONS   Drink plenty of water. Water helps thin the mucus so your sinuses can drain more easily.  Use a humidifier.  Inhale steam 3 to 4 times a day (for  example, sit in the bathroom with the shower running).  Apply a warm, moist washcloth to your face 3 to 4 times a day, or as directed by your health care provider.  Use saline nasal sprays to help moisten and clean your sinuses.  Take medicines only as directed by your health care provider.  If you were prescribed either an antibiotic or antifungal medicine, finish it all even if you start to feel better. SEEK IMMEDIATE MEDICAL CARE IF:  You have increasing pain or severe headaches.  You have nausea, vomiting, or drowsiness.  You have swelling around your face.  You have vision problems.  You have a stiff neck.  You have difficulty breathing. MAKE SURE YOU:   Understand these instructions.  Will watch your condition.  Will get help right away if you are not doing well or get worse. Document Released: 02/23/2005 Document Revised: 07/10/2013 Document Reviewed: 03/10/2011 [facility] Patient Information 2015 [place]. This information is not intended to replace advice given to you by your health care provider. Make sure you discuss any questions you have with your health care provider.

## 2014-07-29 DIAGNOSIS — R51 Headache: Secondary | ICD-10-CM

## 2014-07-29 DIAGNOSIS — K589 Irritable bowel syndrome without diarrhea: Secondary | ICD-10-CM | POA: Insufficient documentation

## 2014-07-29 DIAGNOSIS — L700 Acne vulgaris: Secondary | ICD-10-CM | POA: Insufficient documentation

## 2014-07-29 DIAGNOSIS — R635 Abnormal weight gain: Secondary | ICD-10-CM | POA: Insufficient documentation

## 2014-07-29 DIAGNOSIS — R519 Headache, unspecified: Secondary | ICD-10-CM | POA: Insufficient documentation

## 2014-07-29 DIAGNOSIS — F419 Anxiety disorder, unspecified: Principal | ICD-10-CM

## 2014-07-29 DIAGNOSIS — F329 Major depressive disorder, single episode, unspecified: Secondary | ICD-10-CM | POA: Insufficient documentation

## 2014-07-29 DIAGNOSIS — F32A Depression, unspecified: Secondary | ICD-10-CM | POA: Insufficient documentation

## 2014-07-29 HISTORY — DX: Anxiety disorder, unspecified: F41.9

## 2014-07-29 HISTORY — DX: Depression, unspecified: F32.A

## 2014-07-29 NOTE — Assessment & Plan Note (Addendum)
Mixed constipation and diarrhea types. Abdominal cramping present 1-2 times weekly. Once tried on Bentyl without relief or change in cramps. No bloody stools, nausea, vomiting. Abdomen non tender, soft, BS NL. Will re-address during physical in July

## 2014-07-29 NOTE — Assessment & Plan Note (Signed)
Longstanding history of facial acne. Would like referral for Dermatologist to discuss Accutane. Referral provided.

## 2014-07-29 NOTE — Assessment & Plan Note (Signed)
Present since early 20's. Managed on Fluoxitine 20 mg and Wellbutrin SR for which she would like refills due to being completely out. Feels well managed on this medication. Denies SI/HI and panic attacks. Will provide refills today and continue to monitor.

## 2014-07-29 NOTE — Assessment & Plan Note (Signed)
Reports steady gain over the years despite healthy diet and exercise. Full workup provided by prior PCP which was unremarkable per patient. Will discuss further at upcoming physical.  Body mass index is 26.81 kg/(m^2).

## 2014-07-29 NOTE — Assessment & Plan Note (Signed)
Present for years. Occurs every 2 days. Once had frequent migraines, denies now. Manages with Excedrin or caffeine consumption. Headaches tolerable.

## 2014-09-24 ENCOUNTER — Other Ambulatory Visit: Payer: 59

## 2014-09-27 ENCOUNTER — Encounter: Payer: 59 | Admitting: Primary Care

## 2014-09-28 ENCOUNTER — Telehealth: Payer: Self-pay | Admitting: *Deleted

## 2014-09-28 NOTE — Telephone Encounter (Signed)
Pt left voicemail at Triage. Pt is trying to quit smoking and is requesting a Rx for chantix, pt uses [facility]

## 2014-09-28 NOTE — Telephone Encounter (Signed)
Please notify Carol Castro that I am happy to help her with smoking cessation. She will need to schedule a follow up appointment with me. It appears that I saw her in May and wanted to see her in July for physical. Please have her schedule an appointment for Chantix initiation.  Thanks.

## 2014-10-01 NOTE — Telephone Encounter (Signed)
Called and notified patient of Kate's comments. Patient verbalized understanding. Follow up apt on 10/02/14

## 2014-10-02 ENCOUNTER — Encounter: Payer: Self-pay | Admitting: Primary Care

## 2014-10-02 ENCOUNTER — Ambulatory Visit (INDEPENDENT_AMBULATORY_CARE_PROVIDER_SITE_OTHER): Payer: 59 | Admitting: Primary Care

## 2014-10-02 VITALS — BP 112/64 | HR 79 | Temp 98.1°F | Ht 67.0 in | Wt 162.1 lb

## 2014-10-02 DIAGNOSIS — Z720 Tobacco use: Secondary | ICD-10-CM | POA: Diagnosis not present

## 2014-10-02 DIAGNOSIS — F419 Anxiety disorder, unspecified: Secondary | ICD-10-CM

## 2014-10-02 DIAGNOSIS — F329 Major depressive disorder, single episode, unspecified: Secondary | ICD-10-CM

## 2014-10-02 DIAGNOSIS — F418 Other specified anxiety disorders: Secondary | ICD-10-CM

## 2014-10-02 DIAGNOSIS — G4700 Insomnia, unspecified: Secondary | ICD-10-CM | POA: Diagnosis not present

## 2014-10-02 MED ORDER — TRAZODONE HCL 50 MG PO TABS: 25.0000 mg | ORAL_TABLET | Freq: Every evening | ORAL | Status: DC | PRN

## 2014-10-02 MED ORDER — VARENICLINE TARTRATE 1 MG PO TABS: 1.0000 mg | ORAL_TABLET | Freq: Two times a day (BID) | ORAL | Status: DC

## 2014-10-02 MED ORDER — VARENICLINE TARTRATE 0.5 MG X 11 & 1 MG X 42 PO MISC: ORAL | Status: DC

## 2014-10-02 NOTE — Progress Notes (Signed)
Subjective:    Patient ID: Carol Castro, female    DOB: 1980/10/26, 34 y.o.   MRN: [id number]  HPI  Carol Castro is a 34 year old female who presents today for follow up.   1) Anxiety and Depression: Managed on Fluoxetine 20 mg and Wellbutrin SR. Refills were provided at her new patient visit. Overall she's had difficulty falling asleep at night for the past month, she's going through a lot of personal stress. She's had to take benadryl at night for sleep. She feels well managed on her current regimen except for difficulty sleeping. Denies SI/HI.   2) Tobacco abuse: Smoking cigarettes since age 83. She's been smoking daily 1 PPD for several years up to last week. Since last Wednesday she's been smoking 1-2 cigarettes daily. Her mother has been diagnosed with COPD and she is interested in quitting. She would like to try Chantix to assist with tobacco cessation.  Review of Systems  Constitutional: Negative for unexpected weight change.  Respiratory: Negative for cough and shortness of breath.   Cardiovascular: Negative for chest pain.  Psychiatric/Behavioral: Positive for sleep disturbance. Negative for suicidal ideas. The patient is not nervous/anxious.        Past Medical History  Diagnosis Date  . HPV test positive   . Abnormal Pap smear of cervix   . History of breast augmentation 2009  . Medical history non-contributory   . Depression   . Complication of anesthesia     History   Social History  . Marital Status: Divorced    Spouse Name: N/A  . Number of Children: N/A  . Years of Education: N/A   Occupational History  . Not on file.   Social History Main Topics  . Smoking status: Current Every Day Smoker -- 0.50 packs/day    Types: Cigarettes  . Smokeless tobacco: Not on file  . Alcohol Use: No  . Drug Use: No  . Sexual Activity: Not Currently    Birth Control/ Protection: Injection   Other Topics Concern  . Not on file   Social History Narrative     Past Surgical History  Procedure Laterality Date  . Leep  2006  . Cosmetic surgery    . Breast enhancement surgery  2009  . Laparoscopic bilateral salpingectomy Bilateral 08/28/2013    Procedure: LAPAROSCOPIC BILATERAL SALPINGECTOMY;  Surgeon: Willodean Rosenthal, MD;  Location: [facility];  Service: Gynecology;  Laterality: Bilateral;    Family History  Problem Relation Age of Onset  . Alcohol abuse Father   . Cancer Maternal Aunt   . Cancer Maternal Grandfather     No Known Allergies  Current Outpatient Prescriptions on File Prior to Visit  Medication Sig Dispense Refill  . buPROPion (WELLBUTRIN SR) 150 MG 12 hr tablet Take 1 tablet (150 mg total) by mouth 2 (two) times daily. 60 tablet 5  . FLUoxetine (PROZAC) 20 MG tablet Take 1 tablet (20 mg total) by mouth daily. 30 tablet 5  . fluticasone (FLONASE) 50 MCG/ACT nasal spray Place 2 sprays into both nostrils daily. 16 g 0   No current facility-administered medications on file prior to visit.    BP 112/64 mmHg  Pulse 79  Temp(Src) 98.1 F (36.7 C) (Oral)  Ht  (1.702 m)  Wt 162 lb 1.9 oz (73.537 kg)  BMI 25.39 kg/m2  SpO2 98%  LMP 08/08/2014    Objective:   Physical Exam  Constitutional: She is oriented to person, place, and time. She appears well-nourished.  Cardiovascular: Normal rate and regular rhythm.   Pulmonary/Chest: Effort normal and breath sounds normal.  Neurological: She is alert and oriented to person, place, and time.  Skin: Skin is warm and dry.  Psychiatric: She has a normal mood and affect.          Assessment & Plan:

## 2014-10-02 NOTE — Assessment & Plan Note (Signed)
Smoked cigarettes since age 34. Is interested in quitting.  Will start Chantix starting and continuing packs. Discussed importance of picking a start date. Discussed potential side effects. Follow up in 6 weeks.

## 2014-10-02 NOTE — Patient Instructions (Addendum)
Start with "Chantix Starting Pack" first then progress into the "Continuing Pack".  You must choose a quit date before starting. This quit date will be between first and second week of initiation.  Start Trazodone at bedtime for sleep. Take 1/2 to 1 tablet by mouth at bedtime.  Follow up in 6 weeks for re-evaluation.  It was nice to see you.

## 2014-10-02 NOTE — Assessment & Plan Note (Signed)
Stable per patient on current regimen. Denies SI/HI. Has had difficulty sleeping at night for the past month. Recent new stress in her life. No help with melatonin or benadryl. Will start trazodone 50 mg at bedtime for sleep. Will continue to monitor. Follow up in 6 weeks.

## 2014-10-02 NOTE — Progress Notes (Signed)
Pre visit review using our clinic review tool, if applicable. No additional management support is needed unless otherwise documented below in the visit note. 

## 2014-10-04 ENCOUNTER — Telehealth: Payer: Self-pay

## 2014-10-04 NOTE — Telephone Encounter (Signed)
Called and notified Colin Mulders with [facility] that prior auth for Chantix has been rejected.

## 2014-10-04 NOTE — Telephone Encounter (Signed)
Colin Mulders with [facility] pharmacy left v/m requesting status of Prior auth for med; did not leave name of med.

## 2014-10-05 ENCOUNTER — Telehealth: Payer: Self-pay | Admitting: Primary Care

## 2014-10-05 NOTE — Telephone Encounter (Signed)
Called and left voicemail for patient to try nicotine gum. She is already taking Wellbutrin.

## 2014-10-05 NOTE — Telephone Encounter (Signed)
Pt left v/m; Chantix is not covered by pts ins and pt request different med sent to [facility] pharmacy to stop smoking. Pt request cb.

## 2014-10-19 ENCOUNTER — Ambulatory Visit: Payer: 59 | Admitting: Internal Medicine

## 2014-10-19 ENCOUNTER — Ambulatory Visit: Payer: 59 | Admitting: Primary Care

## 2014-11-13 ENCOUNTER — Ambulatory Visit: Payer: 59 | Admitting: Primary Care

## 2015-01-16 ENCOUNTER — Other Ambulatory Visit: Payer: Self-pay | Admitting: Primary Care

## 2015-01-16 ENCOUNTER — Ambulatory Visit (INDEPENDENT_AMBULATORY_CARE_PROVIDER_SITE_OTHER): Payer: 59 | Admitting: Primary Care

## 2015-01-16 ENCOUNTER — Encounter: Payer: Self-pay | Admitting: Primary Care

## 2015-01-16 VITALS — BP 114/78 | HR 74 | Temp 98.1°F | Ht 67.0 in | Wt 155.8 lb

## 2015-01-16 DIAGNOSIS — B3731 Acute candidiasis of vulva and vagina: Secondary | ICD-10-CM

## 2015-01-16 DIAGNOSIS — F418 Other specified anxiety disorders: Secondary | ICD-10-CM | POA: Diagnosis not present

## 2015-01-16 DIAGNOSIS — G4700 Insomnia, unspecified: Secondary | ICD-10-CM

## 2015-01-16 DIAGNOSIS — Z720 Tobacco use: Secondary | ICD-10-CM

## 2015-01-16 DIAGNOSIS — Z113 Encounter for screening for infections with a predominantly sexual mode of transmission: Secondary | ICD-10-CM | POA: Diagnosis not present

## 2015-01-16 DIAGNOSIS — F329 Major depressive disorder, single episode, unspecified: Secondary | ICD-10-CM

## 2015-01-16 DIAGNOSIS — H938X1 Other specified disorders of right ear: Secondary | ICD-10-CM | POA: Diagnosis not present

## 2015-01-16 DIAGNOSIS — B373 Candidiasis of vulva and vagina: Secondary | ICD-10-CM

## 2015-01-16 DIAGNOSIS — F419 Anxiety disorder, unspecified: Secondary | ICD-10-CM

## 2015-01-16 DIAGNOSIS — F32A Depression, unspecified: Secondary | ICD-10-CM

## 2015-01-16 MED ORDER — TRAZODONE HCL 50 MG PO TABS: 25.0000 mg | ORAL_TABLET | Freq: Every evening | ORAL | Status: DC | PRN

## 2015-01-16 MED ORDER — FLUCONAZOLE 150 MG PO TABS: ORAL_TABLET | ORAL | Status: DC

## 2015-01-16 MED ORDER — BUPROPION HCL ER (SR) 150 MG PO TB12: 150.0000 mg | ORAL_TABLET | Freq: Two times a day (BID) | ORAL | Status: DC

## 2015-01-16 MED ORDER — AMOXICILLIN 500 MG PO CAPS: 500.0000 mg | ORAL_CAPSULE | Freq: Two times a day (BID) | ORAL | Status: DC

## 2015-01-16 MED ORDER — FLUOXETINE HCL 20 MG PO TABS: 20.0000 mg | ORAL_TABLET | Freq: Every day | ORAL | Status: DC

## 2015-01-16 NOTE — Assessment & Plan Note (Signed)
Trazodone initiated in July 2016, she did not come in for follow up as directed. Overall improved on trazodone and will take PRN. Refill provided.

## 2015-01-16 NOTE — Assessment & Plan Note (Signed)
Stable on prozac and wellbutrin. Refills provided today.

## 2015-01-16 NOTE — Progress Notes (Signed)
Pre visit review using our clinic review tool, if applicable. No additional management support is needed unless otherwise documented below in the visit note. 

## 2015-01-16 NOTE — Patient Instructions (Signed)
Start Amoxicillin antibiotics for your ear swelling. Take 1 tablet by mouth twice daily for 7 days.  Start Fluconazole for yeast infection. Take 1 tablet by mouth today. You may repeat in 3 days if symptoms are not improved.  Complete lab work prior to leaving today. I will notify you of your results.  It was a pleasure to see you today!  Monilial Vaginitis Vaginitis in a soreness, swelling and redness (inflammation) of the vagina and vulva. Monilial vaginitis is not a sexually transmitted infection. CAUSES  Yeast vaginitis is caused by yeast (candida) that is normally found in your vagina. With a yeast infection, the candida has overgrown in number to a point that upsets the chemical balance. SYMPTOMS   White, thick vaginal discharge.  Swelling, itching, redness and irritation of the vagina and possibly the lips of the vagina (vulva).  Burning or painful urination.  Painful intercourse. DIAGNOSIS  Things that may contribute to monilial vaginitis are:  Postmenopausal and virginal states.  Pregnancy.  Infections.  Being tired, sick or stressed, especially if you had monilial vaginitis in the past.  Diabetes. Good control will help lower the chance.  Birth control pills.  Tight fitting garments.  Using bubble bath, feminine sprays, douches or deodorant tampons.  Taking certain medications that kill germs (antibiotics).  Sporadic recurrence can occur if you become ill. TREATMENT  Your caregiver will give you medication.  There are several kinds of anti monilial vaginal creams and suppositories specific for monilial vaginitis. For recurrent yeast infections, use a suppository or cream in the vagina 2 times a week, or as directed.  Anti-monilial or steroid cream for the itching or irritation of the vulva may also be used. Get your caregiver's permission.  Painting the vagina with methylene blue solution may help if the monilial cream does not work.  Eating yogurt may  help prevent monilial vaginitis. HOME CARE INSTRUCTIONS   Finish all medication as prescribed.  Do not have sex until treatment is completed or after your caregiver tells you it is okay.  Take warm sitz baths.  Do not douche.  Do not use tampons, especially scented ones.  Wear cotton underwear.  Avoid tight pants and panty hose.  Tell your sexual partner that you have a yeast infection. They should go to their caregiver if they have symptoms such as mild rash or itching.  Your sexual partner should be treated as well if your infection is difficult to eliminate.  Practice safer sex. Use condoms.  Some vaginal medications cause latex condoms to fail. Vaginal medications that harm condoms are:  Cleocin cream.  Butoconazole (Femstat).  Terconazole (Terazol) vaginal suppository.  Miconazole (Monistat) (may be purchased over the counter). SEEK MEDICAL CARE IF:   You have a temperature by mouth above 102 F (38.9 C).  The infection is getting worse after 2 days of treatment.  The infection is not getting better after 3 days of treatment.  You develop blisters in or around your vagina.  You develop vaginal bleeding, and it is not your menstrual period.  You have pain when you urinate.  You develop intestinal problems.  You have pain with sexual intercourse.   This information is not intended to replace advice given to you by your health care provider. Make sure you discuss any questions you have with your health care provider.   Document Released: 12/03/2004 Document Revised: 05/18/2011 Document Reviewed: 08/27/2014 Elsevier Interactive Patient Education [employer]2016 Elsevier Inc.

## 2015-01-16 NOTE — Assessment & Plan Note (Signed)
Never came in for follow up. Currently not taking Chantix. Continues to smoke.

## 2015-01-16 NOTE — Progress Notes (Signed)
Subjective:    Patient ID: Carol Castro, female    DOB: [birth year], 34 y.o.   MRN: [id number]  HPI  Carol Castro is a 34 year old female who presents today with a chief complaint of vaginal discharge. She also reports vaginal itching. Her discharge is whitish looking. She had unprotected intercourse 6 days ago. Her symptoms began about 2 weeks ago. She's not tried anything OTC for her symptoms with worsening symptoms over the past several days. Denies urinary symptoms, fevers, abdominal pain.   2) Ear swelling: She had a piercing 2 months ago. For the past week she's had swelling with a blood blister occur consistently since. She will pop it and expell bloody discharge. No puss or greenish drainage, some tenderness.  Review of Systems  Constitutional: Negative for fever and chills.  HENT: Positive for ear pain.        Blister to right ear near piercing.   Gastrointestinal: Negative for abdominal pain.  Genitourinary: Positive for vaginal discharge. Negative for dysuria, frequency and hematuria.       Vaginal itching.       Past Medical History  Diagnosis Date  . HPV test positive   . Abnormal Pap smear of cervix   . History of breast augmentation 2009  . Medical history non-contributory   . Depression   . Complication of anesthesia     Social History   Social History  . Marital Status: Divorced    Spouse Name: N/A  . Number of Children: N/A  . Years of Education: N/A   Occupational History  . Not on file.   Social History Main Topics  . Smoking status: Current Every Day Smoker -- 0.50 packs/day    Types: Cigarettes  . Smokeless tobacco: Not on file  . Alcohol Use: No  . Drug Use: No  . Sexual Activity: Not Currently    Birth Control/ Protection: Injection   Other Topics Concern  . Not on file   Social History Narrative    Past Surgical History  Procedure Laterality Date  . Leep  2006  . Cosmetic surgery    . Breast enhancement surgery  2009    . Laparoscopic bilateral salpingectomy Bilateral 08/28/2013    Procedure: LAPAROSCOPIC BILATERAL SALPINGECTOMY;  Surgeon: Willodean Rosenthalarolyn Harraway-Smith, MD;  Location: [facility];  Service: Gynecology;  Laterality: Bilateral;    Family History  Problem Relation Age of Onset  . Alcohol abuse Father   . Cancer Maternal Aunt   . Cancer Maternal Grandfather     No Known Allergies  Current Outpatient Prescriptions on File Prior to Visit  Medication Sig Dispense Refill  . buPROPion (WELLBUTRIN SR) 150 MG 12 hr tablet Take 1 tablet (150 mg total) by mouth 2 (two) times daily. 60 tablet 5  . FLUoxetine (PROZAC) 20 MG tablet Take 1 tablet (20 mg total) by mouth daily. 30 tablet 5  . fluticasone (FLONASE) 50 MCG/ACT nasal spray Place 2 sprays into both nostrils daily. 16 g 0  . traZODone (DESYREL) 50 MG tablet Take 0.5-1 tablets (25-50 mg total) by mouth at bedtime as needed for sleep. 30 tablet 1   No current facility-administered medications on file prior to visit.    BP 114/78 mmHg  Pulse 74  Temp(Src) 98.1 F (36.7 C) (Oral)  Ht 5\' 7"  (1.702 m)  Wt 155 lb 12.8 oz (70.67 kg)  BMI 24.40 kg/m2  SpO2 98%  LMP 12/30/2014    Objective:   Physical Exam  Constitutional: She  appears well-nourished.  HENT:  Slight erythema, tenderness, and scabbed wound noted to ear ring hole.  Cardiovascular: Normal rate and regular rhythm.   Pulmonary/Chest: Effort normal and breath sounds normal.  Skin: Skin is warm and dry. There is erythema.          Assessment & Plan:  Vaginal Discharge:  Whitish discharge x 2 weeks with vaginal itching. Last unprotected intercourse with her new partner 6 days ago. Minimal white discharge noted during exam, no CMT. Yeast cells noted on wet prep. No clue cells. CG/Chlamydia sent off. Also labs completed for HIV, RPR, and Hep C. Treated with Diflucan tablet.  Ear swelling:  Mild swelling noted to right ear where new piercing is present.  Tender with erythema.  Appears slightly infected. RX for amoxicillin BID x 7 days. Discussed to keep ear clean, may have to remove ear ring if no improvement. She is to follow up PRN.

## 2015-01-17 ENCOUNTER — Telehealth: Payer: Self-pay | Admitting: Primary Care

## 2015-01-17 LAB — HEPATITIS C ANTIBODY: HCV Ab: NEGATIVE

## 2015-01-17 LAB — GC/CHLAMYDIA PROBE AMP
CT Probe RNA: NEGATIVE
GC Probe RNA: NEGATIVE

## 2015-01-17 LAB — HIV ANTIBODY (ROUTINE TESTING W REFLEX): HIV: NONREACTIVE

## 2015-01-17 LAB — RPR

## 2015-01-17 NOTE — Telephone Encounter (Signed)
Pt called back regarding test results. Please call back at [phone] Thank you

## 2015-01-17 NOTE — Telephone Encounter (Signed)
Called and notified patient of Kate's comments. Patient verbalized understanding.  

## 2015-02-22 ENCOUNTER — Telehealth: Payer: Self-pay | Admitting: Primary Care

## 2015-02-22 NOTE — Telephone Encounter (Signed)
I cannot send in a medication without an exam and formal testing first. She may try Monistat over the counter for vaginal itching. If symptoms progress she may be seen at our Saturday clinic at [facility] in [place].

## 2015-02-22 NOTE — Telephone Encounter (Signed)
Called and notified patient of Kate's comments. Patient verbalized understanding.  

## 2015-02-22 NOTE — Telephone Encounter (Signed)
Pt has yeast infection and is asking if you can call in fluconazole to [facility]. The pharmacy closes early today

## 2015-03-08 ENCOUNTER — Ambulatory Visit (INDEPENDENT_AMBULATORY_CARE_PROVIDER_SITE_OTHER): Payer: 59 | Admitting: Family Medicine

## 2015-03-08 ENCOUNTER — Encounter: Payer: Self-pay | Admitting: Family Medicine

## 2015-03-08 VITALS — BP 106/70 | HR 74 | Temp 98.3°F | Ht 67.0 in | Wt 155.0 lb

## 2015-03-08 DIAGNOSIS — R0981 Nasal congestion: Secondary | ICD-10-CM | POA: Diagnosis not present

## 2015-03-08 DIAGNOSIS — R1031 Right lower quadrant pain: Secondary | ICD-10-CM | POA: Insufficient documentation

## 2015-03-08 DIAGNOSIS — N898 Other specified noninflammatory disorders of vagina: Secondary | ICD-10-CM | POA: Diagnosis not present

## 2015-03-08 DIAGNOSIS — R1030 Lower abdominal pain, unspecified: Secondary | ICD-10-CM

## 2015-03-08 DIAGNOSIS — R1032 Left lower quadrant pain: Secondary | ICD-10-CM

## 2015-03-08 HISTORY — DX: Other specified noninflammatory disorders of vagina: N89.8

## 2015-03-08 LAB — POCT URINALYSIS DIP (MANUAL ENTRY)
Bilirubin, UA: NEGATIVE
Blood, UA: NEGATIVE
Glucose, UA: NEGATIVE
Ketones, POC UA: NEGATIVE
NITRITE UA: NEGATIVE
PROTEIN UA: NEGATIVE
Spec Grav, UA: 1.02
Urobilinogen, UA: 0.2
pH, UA: 6

## 2015-03-08 MED ORDER — METRONIDAZOLE 0.75 % VA GEL
1.0000 | Freq: Two times a day (BID) | VAGINAL | Status: DC
Start: 2015-03-08 — End: 2015-04-24

## 2015-03-08 MED ORDER — MOMETASONE FUROATE 50 MCG/ACT NA SUSP: 2.0000 | Freq: Every day | NASAL | Status: DC

## 2015-03-08 NOTE — Patient Instructions (Addendum)
Stop at front desk on way out to set up referral for US.  Change to different allergy nasal spray.  Complete 5 days of vaginal gel for bacterial vaginitis.

## 2015-03-08 NOTE — Progress Notes (Signed)
Subjective:    Patient ID: Carol Castro, female    DOB: 05-25-1980, 34 y.o.   MRN: [id number]  HPI   34 year old female presetns with recurrent onset yeast infection.  She was seen on 11/9 for STI screen ( returned neg). At that time she was treated for yeast infection with 2 tabs diflucan.  Improved for a few days. In last  Few weeks she has had vaginal discharge ( white thin), odor and itching.  She has also had bilateral abdominal pain in last few months.  Intermittent.  Associated with nausea. May be worse after eating. No burning with urination. No D. Some chronic constipation.. But no different then usual.. Goes about few times week.  Hx of large ovarian cyst. Treated with hormones, shrunk.    Social History /Family History/Past Medical History reviewed and updated if needed.  She had removal of fallopian tubes.  She is sexually active.     Review of Systems  Constitutional: Negative for fever and fatigue.  HENT: Negative for ear pain.   Eyes: Negative for pain.  Respiratory: Negative for chest tightness and shortness of breath.   Cardiovascular: Negative for chest pain, palpitations and leg swelling.  Gastrointestinal: Positive for abdominal pain.  Genitourinary: Negative for dysuria.       Objective:   Physical Exam  Constitutional: Vital signs are normal. She appears well-developed and well-nourished. She is cooperative.  Non-toxic appearance. She does not appear ill. No distress.  HENT:  Head: Normocephalic.  Right Ear: Hearing, tympanic membrane, external ear and ear canal normal. Tympanic membrane is not erythematous, not retracted and not bulging.  Left Ear: Hearing, tympanic membrane, external ear and ear canal normal. Tympanic membrane is not erythematous, not retracted and not bulging.  Nose: Mucosal edema and rhinorrhea present. Right sinus exhibits no maxillary sinus tenderness and no frontal sinus tenderness. Left sinus exhibits no  maxillary sinus tenderness and no frontal sinus tenderness.  Mouth/Throat: Uvula is midline, oropharynx is clear and moist and mucous membranes are normal.  Eyes: Conjunctivae, EOM and lids are normal. Pupils are equal, round, and reactive to light. Lids are everted and swept, no foreign bodies found.  Neck: Trachea normal and normal range of motion. Neck supple. Carotid bruit is not present. No thyroid mass and no thyromegaly present.  Cardiovascular: Normal rate, regular rhythm, S1 normal, S2 normal, normal heart sounds, intact distal pulses and normal pulses.  Exam reveals no gallop and no friction rub.   No murmur heard. Pulmonary/Chest: Effort normal and breath sounds normal. No tachypnea. No respiratory distress. She has no decreased breath sounds. She has no wheezes. She has no rhonchi. She has no rales.  Abdominal: Soft. Normal appearance and bowel sounds are normal. There is tenderness in the right lower quadrant and left lower quadrant. There is no rigidity, no guarding and no CVA tenderness. No hernia.  Mild soreness in bilateral lower abdomen.  Genitourinary: There is no rash, tenderness, lesion or injury on the right labia. There is no rash, tenderness, lesion or injury on the left labia. No erythema, tenderness or bleeding in the vagina. No foreign body around the vagina. No signs of injury around the vagina. Vaginal discharge found.  Neurological: She is alert.  Skin: Skin is warm, dry and intact. No rash noted.  Psychiatric: Her speech is normal and behavior is normal. Judgment and thought content normal. Her mood appears not anxious. Cognition and memory are normal. She does not exhibit a depressed mood.  Assessment & Plan:

## 2015-03-08 NOTE — Progress Notes (Signed)
Pre visit review using our clinic review tool, if applicable. No additional management support is needed unless otherwise documented below in the visit note. 

## 2015-03-12 ENCOUNTER — Ambulatory Visit
Admission: RE | Admit: 2015-03-12 | Discharge: 2015-03-12 | Disposition: A | Discharge status: Home / Self Care | Payer: BLUE CROSS/BLUE SHIELD | Source: Ambulatory Visit | Attending: Family Medicine | Admitting: Family Medicine

## 2015-03-12 DIAGNOSIS — R1030 Lower abdominal pain, unspecified: Secondary | ICD-10-CM | POA: Diagnosis not present

## 2015-03-13 ENCOUNTER — Ambulatory Visit: Payer: Self-pay

## 2015-03-13 ENCOUNTER — Encounter: Payer: Self-pay | Admitting: *Deleted

## 2015-04-16 DIAGNOSIS — R0981 Nasal congestion: Secondary | ICD-10-CM | POA: Insufficient documentation

## 2015-04-16 NOTE — Assessment & Plan Note (Signed)
Trial of nasal steroid spray.

## 2015-04-16 NOTE — Assessment & Plan Note (Addendum)
Eval with  Pelvic and transvag US to assess ovaries and uterus. Doubt appendicitis.

## 2015-04-16 NOTE — Assessment & Plan Note (Signed)
Evidence of BV, mild on wet prep. Treat with metor gel x 5 days. Doubtfully causing abd pain.

## 2015-04-20 ENCOUNTER — Ambulatory Visit (INDEPENDENT_AMBULATORY_CARE_PROVIDER_SITE_OTHER): Payer: 59 | Admitting: Internal Medicine

## 2015-04-20 DIAGNOSIS — R0981 Nasal congestion: Secondary | ICD-10-CM

## 2015-04-20 NOTE — Progress Notes (Signed)
   Subjective:    Patient ID: Carol Castro, female    DOB: 03/13/1980, 35 y.o.   MRN: [id number]  HPI  Encounter opened in error, no visit/pt did not present for appt    Review of Systems     Objective:   Physical Exam        Assessment & Plan:

## 2015-04-21 NOTE — Assessment & Plan Note (Signed)
No evaluation today, no charge as pt was not seen, this visit opened in error

## 2015-04-23 ENCOUNTER — Ambulatory Visit: Payer: 59 | Admitting: Primary Care

## 2015-04-24 ENCOUNTER — Ambulatory Visit (INDEPENDENT_AMBULATORY_CARE_PROVIDER_SITE_OTHER): Payer: BLUE CROSS/BLUE SHIELD | Admitting: Primary Care

## 2015-04-24 ENCOUNTER — Encounter: Payer: Self-pay | Admitting: Primary Care

## 2015-04-24 VITALS — BP 108/70 | HR 83 | Temp 98.1°F | Ht 67.0 in | Wt 159.8 lb

## 2015-04-24 DIAGNOSIS — A499 Bacterial infection, unspecified: Secondary | ICD-10-CM

## 2015-04-24 DIAGNOSIS — F419 Anxiety disorder, unspecified: Secondary | ICD-10-CM

## 2015-04-24 DIAGNOSIS — N898 Other specified noninflammatory disorders of vagina: Secondary | ICD-10-CM | POA: Diagnosis not present

## 2015-04-24 DIAGNOSIS — F418 Other specified anxiety disorders: Secondary | ICD-10-CM | POA: Diagnosis not present

## 2015-04-24 DIAGNOSIS — N760 Acute vaginitis: Secondary | ICD-10-CM

## 2015-04-24 DIAGNOSIS — B9689 Other specified bacterial agents as the cause of diseases classified elsewhere: Secondary | ICD-10-CM

## 2015-04-24 DIAGNOSIS — R41840 Attention and concentration deficit: Secondary | ICD-10-CM

## 2015-04-24 DIAGNOSIS — F329 Major depressive disorder, single episode, unspecified: Secondary | ICD-10-CM

## 2015-04-24 MED ORDER — CLONAZEPAM 0.25 MG PO TBDP: ORAL_TABLET | ORAL | Status: DC

## 2015-04-24 MED ORDER — METRONIDAZOLE 500 MG PO TABS: 500.0000 mg | ORAL_TABLET | Freq: Two times a day (BID) | ORAL | Status: DC

## 2015-04-24 MED ORDER — CLONAZEPAM 0.5 MG PO TABS: ORAL_TABLET | ORAL | Status: DC

## 2015-04-24 NOTE — Progress Notes (Signed)
Pre visit review using our clinic review tool, if applicable. No additional management support is needed unless otherwise documented below in the visit note. 

## 2015-04-24 NOTE — Progress Notes (Signed)
Subjective:    Patient ID: Carol Castro, female    DOB: 28-Aug-1980, 35 y.o.   MRN: [id number]  HPI  Carol Castro is a 35 year old female who presents today with multiple complaints.  1) Anxiety and Depression: Currently managed on Wellbutrin SR 150 mg BID, Fluoxetine 20 mg, and Trazodone 50 mg at bedtime. She reports palpitations, difficulty concentrating, cannot focus on school work, feeling increased amounts of stress. She has never been evaluated for ADHD. She was once managed on max doses of Wellbutrin and Fluoxetine in the past without improvement. She would like further evaluation for ADHD and possibly other psychiatric disorders. She's really having a difficulty time with school, work, and being a single parent.   2) Vaginal Discharge: She also reports itching and foul smelling odor. Her symptoms have been present intermittently for the past several months. She was recently treated for BV several weeks ago with metronidazole gel. Overall her symptoms improved but her symptoms returned when she completed the metronidazole gel. Denies dysuria, flank pain, urinary frequency. She recently underwent evaluation of pelvic discomfort with [place] of pelvis and vagina. Imaging unremarkable.   Review of Systems  Constitutional: Negative for fever and chills.  Genitourinary: Positive for vaginal discharge. Negative for frequency, hematuria and difficulty urinating.  Psychiatric/Behavioral: Positive for decreased concentration. Negative for suicidal ideas and sleep disturbance. The patient is nervous/anxious.        Past Medical History  Diagnosis Date  . HPV test positive   . Abnormal Pap smear of cervix   . History of breast augmentation 2009  . Medical history non-contributory   . Depression   . Complication of anesthesia     Social History   Social History  . Marital Status: Divorced    Spouse Name: N/A  . Number of Children: N/A  . Years of Education: N/A    Occupational History  . Not on file.   Social History Main Topics  . Smoking status: Current Every Day Smoker -- 0.50 packs/day    Types: Cigarettes  . Smokeless tobacco: Never Used  . Alcohol Use: No  . Drug Use: No  . Sexual Activity: Not Currently    Birth Control/ Protection: Injection   Other Topics Concern  . Not on file   Social History Narrative    Past Surgical History  Procedure Laterality Date  . Leep  2006  . Cosmetic surgery    . Breast enhancement surgery  2009  . Laparoscopic bilateral salpingectomy Bilateral 08/28/2013    Procedure: LAPAROSCOPIC BILATERAL SALPINGECTOMY;  Surgeon: Willodean Rosenthal, MD;  Location: [facility];  Service: Gynecology;  Laterality: Bilateral;    Family History  Problem Relation Age of Onset  . Alcohol abuse Father   . Cancer Maternal Aunt   . Cancer Maternal Grandfather     No Known Allergies  Current Outpatient Prescriptions on File Prior to Visit  Medication Sig Dispense Refill  . buPROPion (WELLBUTRIN SR) 150 MG 12 hr tablet Take 1 tablet (150 mg total) by mouth 2 (two) times daily. 60 tablet 3  . CLARAVIS 40 MG capsule Take 40 mg by mouth daily.     Marland Kitchen FLUoxetine (PROZAC) 20 MG tablet Take 1 tablet (20 mg total) by mouth daily. 30 tablet 3  . fluticasone (FLONASE) 50 MCG/ACT nasal spray Place 2 sprays into both nostrils daily. 16 g 0  . mometasone (NASONEX) 50 MCG/ACT nasal spray Place 2 sprays into the nose daily. 17 g 12  . traZODone (  DESYREL) 50 MG tablet Take 0.5-1 tablets (25-50 mg total) by mouth at bedtime as needed for sleep. 30 tablet 3   No current facility-administered medications on file prior to visit.    BP 108/70 mmHg  Pulse 83  Temp(Src) 98.1 F (36.7 C) (Oral)  Ht  (1.702 m)  Wt 159 lb 12.8 oz (72.485 kg)  BMI 25.02 kg/m2  SpO2 98%  LMP 03/11/2015    Objective:   Physical Exam  Constitutional: She appears well-nourished.  Cardiovascular: Normal rate and regular rhythm.    Pulmonary/Chest: Effort normal and breath sounds normal.  Genitourinary: Cervix exhibits no motion tenderness and no discharge. No vaginal discharge found.  Skin: Skin is warm and dry.  Psychiatric: She has a normal mood and affect.          Assessment & Plan:

## 2015-04-24 NOTE — Patient Instructions (Signed)
Stop by the front desk and speak with either Shirlee Limerick or Revonda Standard regarding your referral to psychology for ADHD testing.  You may take clonazepam as needed for anxiety as we discussed.   Start metronidazole 500 mg tablets for bacterial infection. Take 1 tablet by mouth twice daily for 7 days.  Please notify me if no improvement in symptoms.   It was a pleasure to see you today!

## 2015-04-24 NOTE — Assessment & Plan Note (Signed)
Continues to report vaginal discharge and itching. Temporary improvement with metro gel treatment. Exam without discharge or abnormality. Wet prep with some clue cells noted. Sent off for official review. Will send metronidazole tabs to pharmacy at this time for presumed BV.  GC/Chlamydia pending. Wet prep pending. If symptoms persist, will send to GYN.

## 2015-04-24 NOTE — Assessment & Plan Note (Signed)
Increased anxiety recently. Once on max doses of meds, didn't help. Also suspect she may have symptoms of ADHD and will have her tested. Will add back temporary dose of Clonazepam. She understands this is to be used sparingly.  Will continue to monitor.

## 2015-05-15 ENCOUNTER — Ambulatory Visit (INDEPENDENT_AMBULATORY_CARE_PROVIDER_SITE_OTHER): Payer: BLUE CROSS/BLUE SHIELD | Admitting: Psychology

## 2015-05-15 DIAGNOSIS — F349 Persistent mood [affective] disorder, unspecified: Secondary | ICD-10-CM | POA: Diagnosis not present

## 2015-05-15 DIAGNOSIS — F411 Generalized anxiety disorder: Secondary | ICD-10-CM

## 2015-05-15 DIAGNOSIS — F900 Attention-deficit hyperactivity disorder, predominantly inattentive type: Secondary | ICD-10-CM | POA: Diagnosis not present

## 2015-05-23 ENCOUNTER — Ambulatory Visit (INDEPENDENT_AMBULATORY_CARE_PROVIDER_SITE_OTHER): Payer: BLUE CROSS/BLUE SHIELD | Admitting: Primary Care

## 2015-05-23 ENCOUNTER — Encounter: Payer: Self-pay | Admitting: *Deleted

## 2015-05-23 ENCOUNTER — Encounter: Payer: Self-pay | Admitting: Primary Care

## 2015-05-23 VITALS — BP 102/68 | HR 87 | Temp 98.4°F | Wt 155.5 lb

## 2015-05-23 DIAGNOSIS — F418 Other specified anxiety disorders: Secondary | ICD-10-CM

## 2015-05-23 DIAGNOSIS — F900 Attention-deficit hyperactivity disorder, predominantly inattentive type: Secondary | ICD-10-CM

## 2015-05-23 DIAGNOSIS — F419 Anxiety disorder, unspecified: Secondary | ICD-10-CM

## 2015-05-23 DIAGNOSIS — F32A Depression, unspecified: Secondary | ICD-10-CM

## 2015-05-23 DIAGNOSIS — F329 Major depressive disorder, single episode, unspecified: Secondary | ICD-10-CM

## 2015-05-23 MED ORDER — AMPHETAMINE-DEXTROAMPHETAMINE 10 MG PO TABS: 10.0000 mg | ORAL_TABLET | Freq: Two times a day (BID) | ORAL | Status: DC

## 2015-05-23 NOTE — Patient Instructions (Signed)
Start Adderall 10 mg tablets for ADHD. Take 1 tablet by mouth twice daily.  Stop by the lab to sign the controlled substance contract and for your urine drug screen.  Follow up in 1 month for re-evaluation.  It was a pleasure to see you today!  Attention Deficit Hyperactivity Disorder Attention deficit hyperactivity disorder (ADHD) is a problem with behavior issues based on the way the brain functions (neurobehavioral disorder). It is a common reason for behavior and academic problems in school. SYMPTOMS  There are 3 types of ADHD. The 3 types and some of the symptoms include:  Inattentive.  Gets bored or distracted easily.  Loses or forgets things. Forgets to hand in homework.  Has trouble organizing or completing tasks.  Difficulty staying on task.  An inability to organize daily tasks and school work.  Leaving projects, chores, or homework unfinished.  Trouble paying attention or responding to details. Careless mistakes.  Difficulty following directions. Often seems like is not listening.  Dislikes activities that require sustained attention (like chores or homework).  Hyperactive-impulsive.  Feels like it is impossible to sit still or stay in a seat. Fidgeting with hands and feet.  Trouble waiting turn.  Talking too much or out of turn. Interruptive.  Speaks or acts impulsively.  Aggressive, disruptive behavior.  Constantly busy or on the go; noisy.  Often leaves seat when they are expected to remain seated.  Often runs or climbs where it is not appropriate, or feels very restless.  Combined.  Has symptoms of both of the above. Often children with ADHD feel discouraged about themselves and with school. They often perform well below their abilities in school. As children get older, the excess motor activities can calm down, but the problems with paying attention and staying organized persist. Most children do not outgrow ADHD but with good treatment can learn  to cope with the symptoms. DIAGNOSIS  When ADHD is suspected, the diagnosis should be made by professionals trained in ADHD. This professional will collect information about the individual suspected of having ADHD. Information must be collected from various settings where the person lives, works, or attends school.  Diagnosis will include:  Confirming symptoms began in childhood.  Ruling out other reasons for the child's behavior.  The health care providers will check with the child's school and check their medical records.  They will talk to teachers and parents.  Behavior rating scales for the child will be filled out by those dealing with the child on a daily basis. A diagnosis is made only after all information has been considered. TREATMENT  Treatment usually includes behavioral treatment, tutoring or extra support in school, and stimulant medicines. Because of the way a person's brain works with ADHD, these medicines decrease impulsivity and hyperactivity and increase attention. This is different than how they would work in a person who does not have ADHD. Other medicines used include antidepressants and certain blood pressure medicines. Most experts agree that treatment for ADHD should address all aspects of the person's functioning. Along with medicines, treatment should include structured classroom management at school. Parents should reward good behavior, provide constant discipline, and set limits. Tutoring should be available for the child as needed. ADHD is a lifelong condition. If untreated, the disorder can have long-term serious effects into adolescence and adulthood. HOME CARE INSTRUCTIONS   Often with ADHD there is a lot of frustration among family members dealing with the condition. Blame and anger are also feelings that are common. In many  cases, because the problem affects the family as a whole, the entire family may need help. A therapist can help the family find better  ways to handle the disruptive behaviors of the person with ADHD and promote change. If the person with ADHD is young, most of the therapist's work is with the parents. Parents will learn techniques for coping with and improving their child's behavior. Sometimes only the child with the ADHD needs counseling. Your health care providers can help you make these decisions.  Children with ADHD may need help learning how to organize. Some helpful tips include:  Keep routines the same every day from wake-up time to bedtime. Schedule all activities, including homework and playtime. Keep the schedule in a place where the person with ADHD will often see it. Mark schedule changes as far in advance as possible.  Schedule outdoor and indoor recreation.  Have a place for everything and keep everything in its place. This includes clothing, backpacks, and school supplies.  Encourage writing down assignments and bringing home needed books. Work with your child's teachers for assistance in organizing school work.  Offer your child a well-balanced diet. Breakfast that includes a balance of whole grains, protein, and fruits or vegetables is especially important for school performance. Children should avoid drinks with caffeine including:  Soft drinks.  Coffee.  Tea.  However, some older children (adolescents) may find these drinks helpful in improving their attention. Because it can also be common for adolescents with ADHD to become addicted to caffeine, talk with your health care provider about what is a safe amount of caffeine intake for your child.  Children with ADHD need consistent rules that they can understand and follow. If rules are followed, give small rewards. Children with ADHD often receive, and expect, criticism. Look for good behavior and praise it. Set realistic goals. Give clear instructions. Look for activities that can foster success and self-esteem. Make time for pleasant activities with your  child. Give lots of affection.  Parents are their children's greatest advocates. Learn as much as possible about ADHD. This helps you become a stronger and better advocate for your child. It also helps you educate your child's teachers and instructors if they feel inadequate in these areas. Parent support groups are often helpful. A national group with local chapters is called Children and Adults with Attention Deficit Hyperactivity Disorder (CHADD). SEEK MEDICAL CARE IF:  Your child has repeated muscle twitches, cough, or speech outbursts.  Your child has sleep problems.  Your child has a marked loss of appetite.  Your child develops depression.  Your child has new or worsening behavioral problems.  Your child develops dizziness.  Your child has a racing heart.  Your child has stomach pains.  Your child develops headaches. SEEK IMMEDIATE MEDICAL CARE IF:  Your child has been diagnosed with depression or anxiety and the symptoms seem to be getting worse.  Your child has been depressed and suddenly appears to have increased energy or motivation.  You are worried that your child is having a bad reaction to a medication he or she is taking for ADHD.   This information is not intended to replace advice given to you by your health care provider. Make sure you discuss any questions you have with your health care provider.   Document Released: 02/13/2002 Document Revised: 02/28/2013 Document Reviewed: 10/31/2012 Elsevier Interactive Patient Education [employer]

## 2015-05-23 NOTE — Assessment & Plan Note (Signed)
Diagnosed last week after formal evaluation with Dr. Reggy EyeAltabet. ADHD predominantly inattentive type. Spent a long time discussing the disorder and different options for treatment.  Start Adderall 10 mg BID daily. UDS and controlled substance contract obtained. Follow up in 1 month for re-evaluation.

## 2015-05-23 NOTE — Assessment & Plan Note (Signed)
No improvement on meds since last visit. Sent for ADHD testing and was positive. Will continue current meds and add Adderall 10 mg BID. Follow up in 1 month.

## 2015-05-23 NOTE — Progress Notes (Signed)
Pre visit review using our clinic review tool, if applicable. No additional management support is needed unless otherwise documented below in the visit note. 

## 2015-05-23 NOTE — Progress Notes (Signed)
Subjective:    Patient ID: Carol Castro, female    DOB: 1980-05-03, 35 y.o.   MRN: [id number]  HPI  Carol Castro is a 35 year old female who presents today for follow up from ADHD testing.  She was evaluated in February 2017 with increased anxiety despite max dose of current medications. She was sent for ADHD testing for high suspicion based off of symptoms and lack of response to current anxiety medications.  She was evaluated by Dr. 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 She is currently managed on Bupropion SR 150 mg BID, Fluoxetine 20 mg, and clonazepam 0.5 mg. She's not used her clonazepam much since prescribed.   Her main concerns for incompletion of tasks, distractibility, and exhaustion. She is frustrated as she cannot "get things done". She is agreeable to medication for treatment.   Review of Systems  Constitutional: Positive for fatigue.  Respiratory: Negative for shortness of breath.   Cardiovascular: Negative for chest pain and palpitations.  Psychiatric/Behavioral: Positive for decreased concentration and agitation. Negative for suicidal ideas. The patient is nervous/anxious.        Past Medical History  Diagnosis Date  . HPV test positive   . Abnormal Pap smear of cervix   . History of breast augmentation 2009  . Medical history non-contributory   . Depression   . Complication of anesthesia     Social History   Social History  . Marital Status: Divorced    Spouse Name: N/A  . Number of Children: N/A  . Years of Education: N/A   Occupational History  . Not on file.   Social History Main Topics  . Smoking status: Current Every Day Smoker -- 0.50 packs/day    Types: Cigarettes  . Smokeless tobacco: Never Used  . Alcohol Use: No  . Drug Use: No  . Sexual Activity: Not Currently    Birth Control/ Protection: Injection   Other Topics Concern  .  Not on file   Social History Narrative    Past Surgical History  Procedure Laterality Date  . Leep  2006  . Cosmetic surgery    . Breast enhancement surgery  2009  . Laparoscopic bilateral salpingectomy Bilateral 08/28/2013    Procedure: LAPAROSCOPIC BILATERAL SALPINGECTOMY;  Surgeon: Willodean Rosenthal, MD;  Location: [facility];  Service: Gynecology;  Laterality: Bilateral;    Family History  Problem Relation Age of Onset  . Alcohol abuse Father   . Cancer Maternal Aunt   . Cancer Maternal Grandfather     No Known Allergies  Current Outpatient Prescriptions on File Prior to Visit  Medication Sig Dispense Refill  . buPROPion (WELLBUTRIN SR) 150 MG 12 hr tablet Take 1 tablet (150 mg total) by mouth 2 (two) times daily. 60 tablet 3  . CLARAVIS 40 MG capsule Take 40 mg by mouth daily.     . clonazePAM (KLONOPIN) 0.5 MG tablet Take 1 tablet by mouth once daily as needed for anxiety. 30 tablet 0  . FLUoxetine (PROZAC) 20 MG tablet Take 1 tablet (20 mg total) by mouth daily. 30 tablet 3  . fluticasone (FLONASE) 50 MCG/ACT nasal spray Place 2 sprays into both nostrils daily. 16 g 0  . mometasone (NASONEX) 50 MCG/ACT nasal spray Place 2 sprays into the nose daily. 17 g 12  . traZODone (DESYREL) 50 MG tablet Take 0.5-1 tablets (25-50 mg total) by mouth at bedtime as needed for sleep. 30  tablet 3   No current facility-administered medications on file prior to visit.    BP 102/68 mmHg  Pulse 87  Temp(Src) 98.4 F (36.9 C) (Oral)  Wt 155 lb 8 oz (70.534 kg)  SpO2 97%  LMP 03/11/2015    Objective:   Physical Exam  Constitutional: She appears well-nourished.  Cardiovascular: Normal rate and regular rhythm.   Pulmonary/Chest: Effort normal and breath sounds normal.  Skin: Skin is warm and dry.  Psychiatric: She has a normal mood and affect.          Assessment & Plan:

## 2015-06-06 ENCOUNTER — Encounter: Payer: Self-pay | Admitting: Primary Care

## 2015-06-24 ENCOUNTER — Ambulatory Visit (INDEPENDENT_AMBULATORY_CARE_PROVIDER_SITE_OTHER): Payer: BLUE CROSS/BLUE SHIELD | Admitting: Primary Care

## 2015-06-24 ENCOUNTER — Encounter: Payer: Self-pay | Admitting: Primary Care

## 2015-06-24 VITALS — BP 104/64 | HR 80 | Temp 98.3°F | Ht 67.0 in | Wt 152.8 lb

## 2015-06-24 DIAGNOSIS — F900 Attention-deficit hyperactivity disorder, predominantly inattentive type: Secondary | ICD-10-CM

## 2015-06-24 MED ORDER — LISDEXAMFETAMINE DIMESYLATE 30 MG PO CAPS: 30.0000 mg | ORAL_CAPSULE | Freq: Every day | ORAL | Status: DC

## 2015-06-24 NOTE — Progress Notes (Signed)
Pre visit review using our clinic review tool, if applicable. No additional management support is needed unless otherwise documented below in the visit note. 

## 2015-06-24 NOTE — Assessment & Plan Note (Signed)
1 week of improvement on Adderall 10 mg BID, then symptoms returned. Slight improvement on 20 mg BID, no improvement in focus. Do not feel as though Adderall is a good choice for her at this point and will switch to Vyvanse daily. UDS and contract on file.  Follow up in 1 month for re-evaluation.

## 2015-06-24 NOTE — Patient Instructions (Addendum)
Stop Adderall. Start Vyvanse tablets for ADHD. Take 1 tablet by mouth once every morning.   Follow up in 1 month for re-evaluation of ADHD.  It was a pleasure to see you today!

## 2015-06-24 NOTE — Progress Notes (Signed)
Subjective:    Patient ID: Carol Castro, female    DOB: [birth year], 35 y.o.   MRN: [id number]  HPI  Carol Castro is a 35 year old female who presents today for follow up of ADHD. She was initiated on Adderall 10 mg BID last visit as she tested positive for ADHD predominantely inattentive type per [facility].   Since initiation she noticed an improvement with energy and focus for the first week, but then felt no improvement on subsequent weeks. During her first week she felt happy, focused, and enjoyed her daily life.   She's taken 2 of the 10 mg tablets twice daily for several days after she noticed no improvement on the 10 mg BID. The increase provided her with an increase in energy but no improvement in focus. She's frustrated as she's feeling the same as prior to treatment.   Review of Systems  Respiratory: Negative for shortness of breath.   Cardiovascular: Negative for chest pain and palpitations.  Gastrointestinal: Negative for nausea and abdominal pain.  Neurological: Negative for dizziness and headaches.  Psychiatric/Behavioral: Positive for decreased concentration. Negative for suicidal ideas and sleep disturbance. The patient is nervous/anxious.        Past Medical History  Diagnosis Date  . HPV test positive   . Abnormal Pap smear of cervix   . History of breast augmentation 2009  . Medical history non-contributory   . Depression   . Complication of anesthesia      Social History   Social History  . Marital Status: Divorced    Spouse Name: N/A  . Number of Children: N/A  . Years of Education: N/A   Occupational History  . Not on file.   Social History Main Topics  . Smoking status: Current Every Day Smoker -- 0.50 packs/day    Types: Cigarettes  . Smokeless tobacco: Never Used  . Alcohol Use: No  . Drug Use: No  . Sexual Activity: Not Currently    Birth Control/ Protection: Injection   Other Topics Concern  . Not on file    Social History Narrative    Past Surgical History  Procedure Laterality Date  . Leep  2006  . Cosmetic surgery    . Breast enhancement surgery  2009  . Laparoscopic bilateral salpingectomy Bilateral 08/28/2013    Procedure: LAPAROSCOPIC BILATERAL SALPINGECTOMY;  Surgeon: Willodean Rosenthalarolyn Harraway-Smith, MD;  Location: [facility];  Service: Gynecology;  Laterality: Bilateral;    Family History  Problem Relation Age of Onset  . Alcohol abuse Father   . Cancer Maternal Aunt   . Cancer Maternal Grandfather     No Known Allergies  Current Outpatient Prescriptions on File Prior to Visit  Medication Sig Dispense Refill  . buPROPion (WELLBUTRIN SR) 150 MG 12 hr tablet Take 1 tablet (150 mg total) by mouth 2 (two) times daily. 60 tablet 3  . CLARAVIS 40 MG capsule Take 40 mg by mouth daily.     . clonazePAM (KLONOPIN) 0.5 MG tablet Take 1 tablet by mouth once daily as needed for anxiety. 30 tablet 0  . FLUoxetine (PROZAC) 20 MG tablet Take 1 tablet (20 mg total) by mouth daily. 30 tablet 3  . fluticasone (FLONASE) 50 MCG/ACT nasal spray Place 2 sprays into both nostrils daily. 16 g 0  . mometasone (NASONEX) 50 MCG/ACT nasal spray Place 2 sprays into the nose daily. 17 g 12  . traZODone (DESYREL) 50 MG tablet Take 0.5-1 tablets (25-50 mg total) by mouth  at bedtime as needed for sleep. 30 tablet 3   No current facility-administered medications on file prior to visit.    BP 104/64 mmHg  Pulse 80  Temp(Src) 98.3 F (36.8 C) (Oral)  Ht  (1.702 m)  Wt 152 lb 12.8 oz (69.31 kg)  BMI 23.93 kg/m2  SpO2 97%  LMP 06/24/2015    Objective:   Physical Exam  Constitutional: She appears well-nourished.  Cardiovascular: Normal rate, regular rhythm and normal heart sounds.   No murmur heard. Pulmonary/Chest: Effort normal and breath sounds normal.  Skin: Skin is warm and dry.  Psychiatric: She has a normal mood and affect.          Assessment & Plan:

## 2015-07-03 ENCOUNTER — Encounter: Payer: Self-pay | Admitting: Primary Care

## 2015-07-03 ENCOUNTER — Ambulatory Visit (INDEPENDENT_AMBULATORY_CARE_PROVIDER_SITE_OTHER): Payer: BLUE CROSS/BLUE SHIELD | Admitting: Primary Care

## 2015-07-03 VITALS — BP 106/80 | HR 68 | Temp 98.4°F | Ht 67.0 in | Wt 149.0 lb

## 2015-07-03 DIAGNOSIS — F900 Attention-deficit hyperactivity disorder, predominantly inattentive type: Secondary | ICD-10-CM | POA: Diagnosis not present

## 2015-07-03 LAB — COMPREHENSIVE METABOLIC PANEL
ALT: 12 U/L (ref 0–35)
AST: 14 U/L (ref 0–37)
Albumin: 4.2 g/dL (ref 3.5–5.2)
Alkaline Phosphatase: 43 U/L (ref 39–117)
BUN: 10 mg/dL (ref 6–23)
CHLORIDE: 105 meq/L (ref 96–112)
CO2: 28 mEq/L (ref 19–32)
Calcium: 9.3 mg/dL (ref 8.4–10.5)
Creatinine, Ser: 0.82 mg/dL (ref 0.40–1.20)
GFR: 83.87 mL/min (ref >60.00)
GLUCOSE: 105 mg/dL — AB (ref 70–99)
POTASSIUM: 4.4 meq/L (ref 3.5–5.1)
SODIUM: 139 meq/L (ref 135–145)
Total Bilirubin: 0.6 mg/dL (ref 0.2–1.2)
Total Protein: 6.6 g/dL (ref 6.0–8.3)

## 2015-07-03 LAB — TSH: TSH: 0.64 u[IU]/mL (ref 0.35–4.50)

## 2015-07-03 LAB — T4, FREE: Free T4: 1.03 ng/dL (ref 0.60–1.60)

## 2015-07-03 MED ORDER — AMPHETAMINE-DEXTROAMPHETAMINE 20 MG PO TABS: 20.0000 mg | ORAL_TABLET | Freq: Two times a day (BID) | ORAL | Status: DC

## 2015-07-03 NOTE — Progress Notes (Signed)
Subjective:    Patient ID: Carol Castro, female    DOB: Jul 14, 1980, 35 y.o.   MRN: [id number]  HPI  Carol Castro is a 35 year old female who presents today to discuss ADHD medication treatment. She was diagnosed with ADHD predominantly inattentive type several months ago after full evaluation with psychology. She was initiated on Adderall 10 mg BID. She felt improvement for 2 weeks, then no improvement with an increase in anxiety on the Adderall. During her last visit she was switched to Vyvance 30 mg.   Since her last visit she's noticed agitation without any improvement in focus. She believes the Adderall was better. Denies chest pain, shortness of breath, dizziness. She has noticed intermittent palpitations since taking the Vyvanse and has not taken today. Denies SI/HI.  Review of Systems  Respiratory: Negative for shortness of breath.   Cardiovascular: Positive for palpitations. Negative for chest pain.  Neurological: Negative for dizziness and headaches.  Psychiatric/Behavioral: Positive for decreased concentration. Negative for suicidal ideas and sleep disturbance. The patient is nervous/anxious.        Past Medical History  Diagnosis Date  . HPV test positive   . Abnormal Pap smear of cervix   . History of breast augmentation 2009  . Medical history non-contributory   . Depression   . Complication of anesthesia      Social History   Social History  . Marital Status: Divorced    Spouse Name: N/A  . Number of Children: N/A  . Years of Education: N/A   Occupational History  . Not on file.   Social History Main Topics  . Smoking status: Current Every Day Smoker -- 0.50 packs/day    Types: Cigarettes  . Smokeless tobacco: Never Used  . Alcohol Use: No  . Drug Use: No  . Sexual Activity: Not Currently    Birth Control/ Protection: Injection   Other Topics Concern  . Not on file   Social History Narrative    Past Surgical History  Procedure  Laterality Date  . Leep  2006  . Cosmetic surgery    . Breast enhancement surgery  2009  . Laparoscopic bilateral salpingectomy Bilateral 08/28/2013    Procedure: LAPAROSCOPIC BILATERAL SALPINGECTOMY;  Surgeon: Willodean Rosenthalarolyn Harraway-Smith, MD;  Location: [facility];  Service: Gynecology;  Laterality: Bilateral;    Family History  Problem Relation Age of Onset  . Alcohol abuse Father   . Cancer Maternal Aunt   . Cancer Maternal Grandfather     Allergies  Allergen Reactions  . Codeine Hives    Current Outpatient Prescriptions on File Prior to Visit  Medication Sig Dispense Refill  . buPROPion (WELLBUTRIN SR) 150 MG 12 hr tablet Take 1 tablet (150 mg total) by mouth 2 (two) times daily. 60 tablet 3  . CLARAVIS 40 MG capsule Take 40 mg by mouth daily.     . clonazePAM (KLONOPIN) 0.5 MG tablet Take 1 tablet by mouth once daily as needed for anxiety. 30 tablet 0  . FLUoxetine (PROZAC) 20 MG tablet Take 1 tablet (20 mg total) by mouth daily. 30 tablet 3  . fluticasone (FLONASE) 50 MCG/ACT nasal spray Place 2 sprays into both nostrils daily. 16 g 0  . mometasone (NASONEX) 50 MCG/ACT nasal spray Place 2 sprays into the nose daily. 17 g 12  . traZODone (DESYREL) 50 MG tablet Take 0.5-1 tablets (25-50 mg total) by mouth at bedtime as needed for sleep. 30 tablet 3   No current facility-administered medications on  file prior to visit.    BP 106/80 mmHg  Pulse 68  Temp(Src) 98.4 F (36.9 C) (Oral)  Ht  (1.702 m)  Wt 149 lb (67.586 kg)  BMI 23.33 kg/m2  SpO2 98%  LMP 06/24/2015    Objective:   Physical Exam  Constitutional: She appears well-nourished.  Cardiovascular: Normal rate and regular rhythm.   Pulmonary/Chest: Effort normal and breath sounds normal.  Skin: Skin is warm and dry.  Psychiatric: She has a normal mood and affect.          Assessment & Plan:

## 2015-07-03 NOTE — Patient Instructions (Addendum)
Stop taking the Vyvanse.   We've restarted the Adderall and increased the dose. Take 1 tablet 20mg ) by mouth twice daily.   Complete lab work prior to leaving today. I will notify you of your results once received.   Please e-mail me in 2 weeks with an update.

## 2015-07-03 NOTE — Assessment & Plan Note (Signed)
No improvement in symptoms after switching to Vyvanse. Will return to Adderall and increase dose to 20 mg BID. Will also obtain lab work today to rule out other metabolic causes. She is to email me in 2 weeks with an update.  Exam unremarkable.

## 2015-07-03 NOTE — Progress Notes (Signed)
Pre visit review using our clinic review tool, if applicable. No additional management support is needed unless otherwise documented below in the visit note. 

## 2015-07-17 ENCOUNTER — Telehealth: Payer: Self-pay | Admitting: Primary Care

## 2015-07-17 NOTE — Telephone Encounter (Signed)
-----   Message from Doreene NestKatherine K Sky Primo, NP sent at 07/03/2015 11:18 AM EDT ----- Regarding: ADHD Will you please call and check on Ms. Bishara. How's she doing on the Adderall 20 mg?

## 2015-07-18 NOTE — Telephone Encounter (Signed)
Left message on patient's voicemail to return call

## 2015-07-18 NOTE — Telephone Encounter (Signed)
Patient says she is doing well on the Adderall 20 mg and will be scheduling her FU OV with you soon.

## 2015-07-24 ENCOUNTER — Ambulatory Visit: Payer: Self-pay | Admitting: Primary Care

## 2015-07-26 ENCOUNTER — Other Ambulatory Visit: Payer: Self-pay

## 2015-07-26 DIAGNOSIS — F900 Attention-deficit hyperactivity disorder, predominantly inattentive type: Secondary | ICD-10-CM

## 2015-07-26 MED ORDER — AMPHETAMINE-DEXTROAMPHETAMINE 20 MG PO TABS: 20.0000 mg | ORAL_TABLET | Freq: Two times a day (BID) | ORAL | Status: DC

## 2015-07-26 NOTE — Telephone Encounter (Signed)
Pt left v/m requesting rx for Adderall. Call when ready for pick up. Pt last seen and rx last printed # 60 on 07/03/15.

## 2015-07-26 NOTE — Telephone Encounter (Signed)
Message left for patient to return my call.  

## 2015-07-26 NOTE — Telephone Encounter (Addendum)
Patient called back and notified that the current dosage is working better than last time. She is doing okay.  Also notified patient that  Rx at the front office and is ready for pick up.

## 2015-08-30 ENCOUNTER — Other Ambulatory Visit: Payer: Self-pay | Admitting: Primary Care

## 2015-08-30 ENCOUNTER — Other Ambulatory Visit: Payer: Self-pay

## 2015-08-30 DIAGNOSIS — F900 Attention-deficit hyperactivity disorder, predominantly inattentive type: Secondary | ICD-10-CM

## 2015-08-30 MED ORDER — AMPHETAMINE-DEXTROAMPHETAMINE 20 MG PO TABS: 20.0000 mg | ORAL_TABLET | Freq: Two times a day (BID) | ORAL | Status: DC

## 2015-08-30 MED ORDER — AMPHETAMINE-DEXTROAMPHETAMINE 20 MG PO TABS: 20.0000 mg | ORAL_TABLET | Freq: Every day | ORAL | Status: DC

## 2015-08-30 NOTE — Telephone Encounter (Signed)
Pt left v/m requesting rx for Adderall. Call when ready for pick up. rx last printed # 60 on 07/26/15. Pt last seen on 07/03/15.

## 2015-08-30 NOTE — Telephone Encounter (Signed)
Message left for patient to return my call.  

## 2015-09-02 NOTE — Telephone Encounter (Signed)
Spoken to patient and notified patient that Rx for Adderall is ready for pick up. Left on front office.

## 2015-10-07 ENCOUNTER — Ambulatory Visit (INDEPENDENT_AMBULATORY_CARE_PROVIDER_SITE_OTHER): Payer: BLUE CROSS/BLUE SHIELD | Admitting: Family Medicine

## 2015-10-07 ENCOUNTER — Encounter: Payer: Self-pay | Admitting: Primary Care

## 2015-10-07 ENCOUNTER — Encounter: Payer: Self-pay | Admitting: Family Medicine

## 2015-10-07 ENCOUNTER — Telehealth: Payer: Self-pay | Admitting: Primary Care

## 2015-10-07 ENCOUNTER — Ambulatory Visit (INDEPENDENT_AMBULATORY_CARE_PROVIDER_SITE_OTHER)
Admission: RE | Admit: 2015-10-07 | Discharge: 2015-10-07 | Disposition: A | Discharge status: Home / Self Care | Payer: BLUE CROSS/BLUE SHIELD | Source: Ambulatory Visit | Attending: Family Medicine | Admitting: Family Medicine

## 2015-10-07 VITALS — BP 92/66 | HR 84 | Temp 98.4°F | Wt 142.5 lb

## 2015-10-07 DIAGNOSIS — R0600 Dyspnea, unspecified: Secondary | ICD-10-CM

## 2015-10-07 DIAGNOSIS — L609 Nail disorder, unspecified: Secondary | ICD-10-CM

## 2015-10-07 HISTORY — DX: Dyspnea, unspecified: R06.00

## 2015-10-07 HISTORY — DX: Nail disorder, unspecified: L60.9

## 2015-10-07 MED ORDER — ALBUTEROL SULFATE HFA 108 (90 BASE) MCG/ACT IN AERS: 1.0000 | INHALATION_SPRAY | Freq: Four times a day (QID) | RESPIRATORY_TRACT | 0 refills | Status: DC | PRN

## 2015-10-07 NOTE — Assessment & Plan Note (Signed)
No clear cause for sx, other than smoking.  D/w pt about cessation.  Would be reasonable to try SABA prn in the meantime in case of occult bronchospasm or mucous plugging.  D/w pt.  Okay for outpatient f/u.  No inc wob, speaking in complete sentences.  >25 minutes spent in face to face time with patient, >50% spent in counselling or coordination of care.

## 2015-10-07 NOTE — Progress Notes (Signed)
Smoking 2 packs a day, not ready to quit.  D/w pt.    2 weeks of sensation of not being able to get a deep breath.  Normal tidal breathing.  Fatigued.  Yawning a lot.  Some nausea when yawning, no vomiting.  No pain, no chest pain.  No fevers, no chills.  Some cough, mild.  Some occ sputum, not a lot, sometime discolored.  Not wheezing.    Off wellbutrin and trazodone in the meantime.  Med list reviewed.   Medial L 1st toenail trimmed about 4 days ago with pedicure.  She was complaining about Ingrown nail.   Meds, vitals, and allergies reviewed.   ROS: Per HPI unless specifically indicated in ROS section   GEN: nad, alert and oriented HEENT: mucous membranes moist NECK: supple w/o LA CV: rrr.  no murmur PULM: ctab, no inc wob ABD: soft, +bs EXT: no edema L foot with distal edge of 1st nail almost completely visible and not ingrown, medial edge of L 1st toenail minimally encroached by tissue.

## 2015-10-07 NOTE — Patient Instructions (Signed)
Use the inhaler in the meantime and update Clark if not better.  Take care.  Glad to see you.

## 2015-10-07 NOTE — Assessment & Plan Note (Signed)
She doesn't need nail excision.  dw pt.  Would likely be better off with letting nail grow out.  Would soak foot/nail daily and then try to manipulate/elevate the medial edge of the distal nail with a nail file.  Partial excision would likely only set the process back and not resolve the issue.   D/w pt.

## 2015-10-07 NOTE — Progress Notes (Signed)
Pre visit review using our clinic review tool, if applicable. No additional management support is needed unless otherwise documented below in the visit note. 

## 2015-10-07 NOTE — Telephone Encounter (Signed)
Pt as appt with Dr Para March on 10/07/15 at 3:15.

## 2015-10-07 NOTE — Telephone Encounter (Signed)
Patient Name: Carol Castro  DOB: 11/08/1980    Initial Comment Caller states having shortness of breath   Nurse Assessment  Nurse: Scarlette Ar, RN, Viki Date/Time (Eastern Time): 10/07/2015 12:43:30 PM  Confirm and document reason for call. If symptomatic, describe symptoms. You must click the next button to save text entered. ---Caller states that she has been yawning excessively for the last couple of weeks.  Has the patient traveled out of the country within the last 30 days? ---Not Applicable  Does the patient have any new or worsening symptoms? ---Yes  Will a triage be completed? ---Yes  Related visit to physician within the last 2 weeks? ---No  Does the PT have any chronic conditions? (i.e. diabetes, asthma, etc.) ---Yes  List chronic conditions. ---see MR  Is the patient pregnant or possibly pregnant? (Ask all females between the ages of 74-55) ---No  Is this a behavioral health or substance abuse call? ---No     Guidelines    Guideline Title Affirmed Question Affirmed Notes  Breathing Difficulty [1] MILD difficulty breathing (e.g., minimal/no SOB at rest, SOB with walking, pulse <100) AND [2] NEW-onset or WORSE than normal    Final Disposition User   See Physician within 4 Hours (or PCP triage) Scarlette Ar, RN, Herbert Seta    Comments  Appt with Dr. Para March at 3:15 pm.   Referrals  REFERRED TO PCP OFFICE   Disagree/Comply: Comply

## 2015-11-27 ENCOUNTER — Other Ambulatory Visit: Payer: Self-pay | Admitting: Primary Care

## 2015-11-27 DIAGNOSIS — F900 Attention-deficit hyperactivity disorder, predominantly inattentive type: Secondary | ICD-10-CM

## 2015-11-27 DIAGNOSIS — J329 Chronic sinusitis, unspecified: Secondary | ICD-10-CM

## 2015-11-28 ENCOUNTER — Other Ambulatory Visit: Payer: Self-pay | Admitting: Primary Care

## 2015-11-28 DIAGNOSIS — F900 Attention-deficit hyperactivity disorder, predominantly inattentive type: Secondary | ICD-10-CM

## 2015-11-28 MED ORDER — FLUTICASONE PROPIONATE 50 MCG/ACT NA SUSP: 2.0000 | Freq: Every day | NASAL | 5 refills | Status: DC

## 2015-11-28 MED ORDER — AMPHETAMINE-DEXTROAMPHETAMINE 20 MG PO TABS: 20.0000 mg | ORAL_TABLET | Freq: Two times a day (BID) | ORAL | 0 refills | Status: DC

## 2016-02-24 ENCOUNTER — Other Ambulatory Visit: Payer: Self-pay | Admitting: Primary Care

## 2016-02-24 DIAGNOSIS — F900 Attention-deficit hyperactivity disorder, predominantly inattentive type: Secondary | ICD-10-CM

## 2016-02-25 ENCOUNTER — Other Ambulatory Visit: Payer: Self-pay | Admitting: Primary Care

## 2016-02-25 DIAGNOSIS — F900 Attention-deficit hyperactivity disorder, predominantly inattentive type: Secondary | ICD-10-CM

## 2016-02-25 MED ORDER — AMPHETAMINE-DEXTROAMPHETAMINE 20 MG PO TABS: 20.0000 mg | ORAL_TABLET | Freq: Two times a day (BID) | ORAL | 0 refills | Status: DC

## 2016-02-26 NOTE — Telephone Encounter (Signed)
Spoken to patient and notified her that Rx is ready for pick. However, patient needs to update her UDS and patient was notified this need to be done.  Also notified patient that she will need a follow up in April 2018. Patient stated that she is not sure if she will be back next year. Patient stated that her insurance is too expensive for next so she may apply for medicaid. Notified patient to let us know what she wants to next.

## 2016-03-03 ENCOUNTER — Encounter: Payer: Self-pay | Admitting: Primary Care

## 2017-02-06 ENCOUNTER — Other Ambulatory Visit: Payer: Self-pay

## 2017-02-06 ENCOUNTER — Encounter: Payer: Self-pay | Admitting: Emergency Medicine

## 2017-02-06 ENCOUNTER — Emergency Department
Admission: EM | Admit: 2017-02-06 | Discharge: 2017-02-06 | Disposition: A | Discharge status: Home / Self Care | Payer: Medicaid Other | Attending: Emergency Medicine | Admitting: Emergency Medicine

## 2017-02-06 DIAGNOSIS — M5442 Lumbago with sciatica, left side: Secondary | ICD-10-CM | POA: Insufficient documentation

## 2017-02-06 DIAGNOSIS — F901 Attention-deficit hyperactivity disorder, predominantly hyperactive type: Secondary | ICD-10-CM | POA: Insufficient documentation

## 2017-02-06 DIAGNOSIS — Z79899 Other long term (current) drug therapy: Secondary | ICD-10-CM | POA: Diagnosis not present

## 2017-02-06 DIAGNOSIS — M5441 Lumbago with sciatica, right side: Secondary | ICD-10-CM

## 2017-02-06 DIAGNOSIS — F17210 Nicotine dependence, cigarettes, uncomplicated: Secondary | ICD-10-CM | POA: Diagnosis not present

## 2017-02-06 DIAGNOSIS — M545 Low back pain: Secondary | ICD-10-CM | POA: Diagnosis present

## 2017-02-06 MED ORDER — ORPHENADRINE CITRATE ER 100 MG PO TB12: 100.0000 mg | ORAL_TABLET | Freq: Two times a day (BID) | ORAL | 0 refills | Status: AC

## 2017-02-06 MED ORDER — DIAZEPAM 2 MG PO TABS
2.0000 mg | ORAL_TABLET | Freq: Once | ORAL | Status: AC
Start: 2017-02-06 — End: 2017-02-06
  Administered 2017-02-06: 2 mg via ORAL
  Filled 2017-02-06: qty 1

## 2017-02-06 MED ORDER — ORPHENADRINE CITRATE 30 MG/ML IJ SOLN
60.0000 mg | Freq: Two times a day (BID) | INTRAMUSCULAR | Status: DC
  Administered 2017-02-06: 60 mg via INTRAMUSCULAR
  Filled 2017-02-06: qty 2

## 2017-02-06 MED ORDER — DEXAMETHASONE SODIUM PHOSPHATE 10 MG/ML IJ SOLN
20.0000 mg | Freq: Once | INTRAMUSCULAR | Status: AC
  Administered 2017-02-06: 20 mg via INTRAMUSCULAR
  Filled 2017-02-06: qty 2

## 2017-02-06 MED ORDER — PREDNISONE 50 MG PO TABS: ORAL_TABLET | ORAL | 0 refills | Status: DC

## 2017-02-06 NOTE — ED Notes (Signed)
See triage noted   Presents with left sided back pain which started about 4 days ago  Pain radiates from left mid back to left lower back  Ambulates slowly d/t increased pain   states she has taken ibu w/o relief  Denies any urinary sx's or injury

## 2017-02-06 NOTE — ED Triage Notes (Signed)
Low back pain x 4 days. Denies fall or injury.  

## 2017-02-06 NOTE — ED Provider Notes (Signed)
[facility] Emergency Department Provider Note  ____________________________________________  Time seen: Approximately 6:04 PM  I have reviewed the triage vital signs and the nursing notes.   HISTORY  Chief Complaint Back Pain    HPI Carol Castro is a 36 y.o. female presents to the emergency department with 10 out of 10 low back pain.  Patient reports that she experiences intermittent bilateral lower extremity radiculopathy.  Patient has been ambulating but states that pain is severe enough that she wants to "lie down in bed".  Patient reports no saddle anesthesia and has had no bowel or bladder incontinence.  She denies falls or other mechanisms of trauma.  She reports an exacerbation of low back pain approximately 2 years ago during an episode of heavy lifting.  Patient reports that she has been under the  care of a chiropractor and can no longer "afford to go".  Patient reports that she recently started a new job and she has been unable to comply with the physical demands of the job.  Patient is very anxious that medications will not relieve her low back pain.  She denies chest pain, chest tightness and abdominal pain.  Patient has tried Vicodin and anti-inflammatories, which have not relieved her pain.   Past Medical History:  Diagnosis Date  . Abnormal Pap smear of cervix   . Complication of anesthesia   . Depression   . History of breast augmentation 2009  . HPV test positive   . Medical history non-contributory     Patient Active Problem List   Diagnosis Date Noted  . Dyspnea 10/07/2015  . Nail complaint 10/07/2015  . Attention deficit hyperactivity disorder (ADHD), predominantly inattentive type 05/23/2015  . Vaginal discharge 03/08/2015  . Insomnia 01/16/2015  . Tobacco abuse 10/02/2014  . Anxiety and depression 07/29/2014  . Frequent headaches 07/29/2014  . IBS (irritable bowel syndrome) 07/29/2014  . Acne vulgaris 07/29/2014  .  Weight gain 07/29/2014    Past Surgical History:  Procedure Laterality Date  . BREAST ENHANCEMENT SURGERY  2009  . COSMETIC SURGERY    . LAPAROSCOPIC BILATERAL SALPINGECTOMY Bilateral 08/28/2013   Procedure: LAPAROSCOPIC BILATERAL SALPINGECTOMY;  Surgeon: Willodean Rosenthalarolyn Harraway-Smith, MD;  Location: [facility];  Service: Gynecology;  Laterality: Bilateral;  . LEEP  2006    Prior to Admission medications   Medication Sig Start Date End Date Taking? Authorizing Provider  albuterol (PROVENTIL HFA;VENTOLIN HFA) 108 (90 Base) MCG/ACT inhaler Inhale 1-2 puffs into the lungs every 6 (six) hours as needed for shortness of breath. Okay to fill with proair, ventolin or albuterol 10/07/15   Joaquim Namuncan, Graham S, MD  amphetamine-dextroamphetamine (ADDERALL) 20 MG tablet Take 1 tablet (20 mg total) by mouth 2 (two) times daily. 02/25/16   Doreene Nestlark, Katherine K, NP  amphetamine-dextroamphetamine (ADDERALL) 20 MG tablet Take 1 tablet (20 mg total) by mouth 2 (two) times daily. 02/25/16   Doreene Nestlark, Katherine K, NP  amphetamine-dextroamphetamine (ADDERALL) 20 MG tablet Take 1 tablet (20 mg total) by mouth 2 (two) times daily. 02/25/16   Doreene Nestlark, Katherine K, NP  buPROPion (WELLBUTRIN SR) 150 MG 12 hr tablet Take 1 tablet (150 mg total) by mouth 2 (two) times daily. Patient not taking: Reported on 10/07/2015 01/16/15   Doreene Nestlark, Katherine K, NP  clonazePAM (KLONOPIN) 0.5 MG tablet Take 1 tablet by mouth once daily as needed for anxiety. 04/24/15   Doreene Nestlark, Katherine K, NP  FLUoxetine (PROZAC) 20 MG tablet Take 1 tablet (20 mg total) by mouth daily. 01/16/15  Doreene Nest, NP  fluticasone [facility]) 50 MCG/ACT nasal spray Place 2 sprays into both nostrils daily. 11/28/15   Doreene Nest, NP  orphenadrine (NORFLEX) 100 MG tablet Take 1 tablet (100 mg total) by mouth 2 (two) times daily for 10 days. 02/06/17 02/16/17  Orvil Feil, PA-C  predniSONE (DELTASONE) 50 MG tablet Take one tablet daily for the next five days. 02/06/17    Orvil Feil, PA-C  traZODone (DESYREL) 50 MG tablet Take 0.5-1 tablets (25-50 mg total) by mouth at bedtime as needed for sleep. Patient not taking: Reported on 10/07/2015 01/16/15   Doreene Nest, NP    Allergies Patient has no known allergies.  Family History  Problem Relation Age of Onset  . Alcohol abuse Father   . Cancer Maternal Aunt   . Cancer Maternal Grandfather     Social History Social History   Tobacco Use  . Smoking status: Current Every Day Smoker    Packs/day: 1.00    Types: Cigarettes  . Smokeless tobacco: Never Used  Substance Use Topics  . Alcohol use: No    Alcohol/week: 0.0 oz  . Drug use: No     Review of Systems  Constitutional: No fever/chills Eyes: No visual changes. No discharge ENT: No upper respiratory complaints. Cardiovascular: no chest pain. Respiratory: no cough. No SOB. Gastrointestinal: No abdominal pain.  No nausea, no vomiting.  No diarrhea.  No constipation. Genitourinary: Negative for dysuria. No hematuria Musculoskeletal: Patient has low back pain.  Skin: Negative for rash, abrasions, lacerations, ecchymosis. Neurological: Negative for headaches, focal weakness or numbness.   ____________________________________________   PHYSICAL EXAM:  VITAL SIGNS: ED Triage Vitals  Enc Vitals Group     BP 02/06/17 1716 (!) 122/99     Pulse Rate 02/06/17 1716 94     Resp 02/06/17 1716 20     Temp 02/06/17 1716 98 F (36.7 C)     Temp Source 02/06/17 1716 Oral     SpO2 02/06/17 1716 99 %     Weight 02/06/17 1717 160 lb (72.6 kg)     Height 02/06/17 1717 5\' 7"  (1.702 m)     Head Circumference --      Peak Flow --      Pain Score 02/06/17 1716 9     Pain Loc --      Pain Edu? --      Excl. in GC? --      Constitutional: Alert and oriented. Well appearing and in no acute distress. Eyes: Conjunctivae are normal. PERRL. EOMI. Head: Atraumatic. ENT:  Cardiovascular: Normal rate, regular rhythm. Normal S1 and S2.  Good  peripheral circulation. Respiratory: Normal respiratory effort without tachypnea or retractions. Lungs CTAB. Good air entry to the bases with no decreased or absent breath sounds. Gastrointestinal: Bowel sounds 4 quadrants. Soft and nontender to palpation. No guarding or rigidity. No palpable masses. No distention. No CVA tenderness. Musculoskeletal: Full range of motion to all extremities. No gross deformities appreciated.  No paraspinal muscle tenderness.  Patient's pain is worsened with flexion at the spine.  Positive straight leg raise test bilaterally.  Palpable dorsalis pedis pulse bilaterally and symmetrically. Neurologic:  Normal speech and language. No gross focal neurologic deficits are appreciated.  Skin:  Skin is warm, dry and intact. No rash noted. Psychiatric: Mood and affect are normal. Speech and behavior are normal. Patient exhibits appropriate insight and judgement.   ____________________________________________   LABS (all labs ordered are listed, but only abnormal  results are displayed)  Labs Reviewed - No data to display ____________________________________________  EKG   ____________________________________________  RADIOLOGY   No results found.  ____________________________________________    PROCEDURES  Procedure(s) performed:    Procedures    Medications  orphenadrine (NORFLEX) injection 60 mg (60 mg Intramuscular Given 02/06/17 1810)  dexamethasone (DECADRON) injection 20 mg (20 mg Intramuscular Given 02/06/17 1809)  diazepam (VALIUM) tablet 2 mg (2 mg Oral Given 02/06/17 1814)     ____________________________________________   INITIAL IMPRESSION / ASSESSMENT AND PLAN / ED COURSE  Pertinent labs & imaging results that were available during my care of the patient were reviewed by me and considered in my medical decision making (see chart for details).  Review of the [place] CSRS was performed in accordance of the [facility] prior to dispensing any  controlled drugs.    Assessment and Plan: Low Back Pain: Patient presents to the emergency department with low back pain.  Patient denies falls or instances of trauma.  Further workup with x-rays is not warranted at this time.  Patient's pain improved significantly with Norflex, Decadron and Valium.  Patient was discharged with Norflex and prednisone.  A referral was made to Dr. Marcell BarlowYarborough.  She was advised to follow-up as soon as possible.  Vital signs were reassuring prior to discharge.  All patient questions were answered.   ____________________________________________  FINAL CLINICAL IMPRESSION(S) / ED DIAGNOSES  Final diagnoses:  Acute bilateral low back pain with bilateral sciatica      NEW MEDICATIONS STARTED DURING THIS VISIT:  ED Discharge Orders        Ordered    orphenadrine (NORFLEX) 100 MG tablet  2 times daily     02/06/17 1858    predniSONE (DELTASONE) 50 MG tablet     02/06/17 1858          This chart was dictated using voice recognition software/Dragon. Despite best efforts to proofread, errors can occur which can change the meaning. Any change was purely unintentional.    Orvil FeilWoods, Chasyn Cinque M, PA-C 02/06/17 1911    Merrily Brittleifenbark, Neil, MD 02/07/17 [id number]

## 2017-02-17 ENCOUNTER — Other Ambulatory Visit: Payer: Self-pay | Admitting: Student

## 2017-02-17 DIAGNOSIS — M549 Dorsalgia, unspecified: Principal | ICD-10-CM

## 2017-02-17 DIAGNOSIS — G8929 Other chronic pain: Secondary | ICD-10-CM

## 2017-03-04 ENCOUNTER — Ambulatory Visit: Payer: Medicaid Other

## 2017-06-04 ENCOUNTER — Other Ambulatory Visit: Payer: Self-pay

## 2017-06-04 ENCOUNTER — Encounter: Payer: Self-pay | Admitting: Emergency Medicine

## 2017-06-04 ENCOUNTER — Emergency Department
Admission: EM | Admit: 2017-06-04 | Discharge: 2017-06-04 | Disposition: A | Discharge status: Home / Self Care | Payer: Medicaid Other | Attending: Emergency Medicine | Admitting: Emergency Medicine

## 2017-06-04 DIAGNOSIS — F17210 Nicotine dependence, cigarettes, uncomplicated: Secondary | ICD-10-CM | POA: Diagnosis not present

## 2017-06-04 DIAGNOSIS — Z79899 Other long term (current) drug therapy: Secondary | ICD-10-CM | POA: Insufficient documentation

## 2017-06-04 DIAGNOSIS — M5441 Lumbago with sciatica, right side: Secondary | ICD-10-CM | POA: Diagnosis not present

## 2017-06-04 DIAGNOSIS — M5442 Lumbago with sciatica, left side: Secondary | ICD-10-CM

## 2017-06-04 DIAGNOSIS — M545 Low back pain: Secondary | ICD-10-CM | POA: Diagnosis present

## 2017-06-04 MED ORDER — PREDNISONE 50 MG PO TABS: ORAL_TABLET | ORAL | 0 refills | Status: DC

## 2017-06-04 MED ORDER — DEXAMETHASONE SODIUM PHOSPHATE 10 MG/ML IJ SOLN: 20.0000 mg | Freq: Once | INTRAMUSCULAR | Status: DC

## 2017-06-04 MED ORDER — ORPHENADRINE CITRATE 30 MG/ML IJ SOLN
60.0000 mg | Freq: Two times a day (BID) | INTRAMUSCULAR | Status: DC
  Filled 2017-06-04: qty 2

## 2017-06-04 MED ORDER — KETOROLAC TROMETHAMINE 30 MG/ML IJ SOLN
30.0000 mg | Freq: Once | INTRAMUSCULAR | Status: AC
  Administered 2017-06-04: 30 mg via INTRAMUSCULAR
  Filled 2017-06-04: qty 1

## 2017-06-04 MED ORDER — DIAZEPAM 2 MG PO TABS
2.0000 mg | ORAL_TABLET | Freq: Once | ORAL | Status: AC
  Administered 2017-06-04: 2 mg via ORAL
  Filled 2017-06-04: qty 1

## 2017-06-04 NOTE — ED Provider Notes (Signed)
[facility] Emergency Department Provider Note  ____________________________________________  Time seen: Approximately 8:05 PM  I have reviewed the triage vital signs and the nursing notes.   HISTORY  Chief Complaint Back Pain    HPI Carol Castro is a 37 y.o. female with a history of chronic back pain, presents to the emergency department for an exacerbation of her chronic back pain.  Patient reports that she was seen in December for similar symptoms and reports that her pain became manageable after she finished a course of prednisone.  Patient established care with neurosurgery, who recommended MRI. patient's insurance company initially declined MRI as they requested that patient complete a trial of physical therapy and have records from her chiropractor sent to insurance company.  Patient refused physical therapy and assistance with mediating medical record transfer.  Patient reports worsening pain this evening in the absence of trauma.  She is here for pain management.   Past Medical History:  Diagnosis Date  . Abnormal Pap smear of cervix   . Complication of anesthesia   . Depression   . History of breast augmentation 2009  . HPV test positive   . Medical history non-contributory     Patient Active Problem List   Diagnosis Date Noted  . Dyspnea 10/07/2015  . Nail complaint 10/07/2015  . Attention deficit hyperactivity disorder (ADHD), predominantly inattentive type 05/23/2015  . Vaginal discharge 03/08/2015  . Insomnia 01/16/2015  . Tobacco abuse 10/02/2014  . Anxiety and depression 07/29/2014  . Frequent headaches 07/29/2014  . IBS (irritable bowel syndrome) 07/29/2014  . Acne vulgaris 07/29/2014  . Weight gain 07/29/2014    Past Surgical History:  Procedure Laterality Date  . BREAST ENHANCEMENT SURGERY  2009  . COSMETIC SURGERY    . LAPAROSCOPIC BILATERAL SALPINGECTOMY Bilateral 08/28/2013   Procedure: LAPAROSCOPIC BILATERAL  SALPINGECTOMY;  Surgeon: Willodean Rosenthalarolyn Harraway-Smith, MD;  Location: [facility];  Service: Gynecology;  Laterality: Bilateral;  . LEEP  2006    Prior to Admission medications   Medication Sig Start Date End Date Taking? Authorizing Provider  albuterol (PROVENTIL HFA;VENTOLIN HFA) 108 (90 Base) MCG/ACT inhaler Inhale 1-2 puffs into the lungs every 6 (six) hours as needed for shortness of breath. Okay to fill with proair, ventolin or albuterol 10/07/15   Joaquim Namuncan, Graham S, MD  amphetamine-dextroamphetamine (ADDERALL) 20 MG tablet Take 1 tablet (20 mg total) by mouth 2 (two) times daily. 02/25/16   Doreene Nestlark, Katherine K, NP  amphetamine-dextroamphetamine (ADDERALL) 20 MG tablet Take 1 tablet (20 mg total) by mouth 2 (two) times daily. 02/25/16   Doreene Nestlark, Katherine K, NP  amphetamine-dextroamphetamine (ADDERALL) 20 MG tablet Take 1 tablet (20 mg total) by mouth 2 (two) times daily. 02/25/16   Doreene Nestlark, Katherine K, NP  buPROPion (WELLBUTRIN SR) 150 MG 12 hr tablet Take 1 tablet (150 mg total) by mouth 2 (two) times daily. Patient not taking: Reported on 10/07/2015 01/16/15   Doreene Nestlark, Katherine K, NP  clonazePAM (KLONOPIN) 0.5 MG tablet Take 1 tablet by mouth once daily as needed for anxiety. 04/24/15   Doreene Nestlark, Katherine K, NP  FLUoxetine (PROZAC) 20 MG tablet Take 1 tablet (20 mg total) by mouth daily. 01/16/15   Doreene Nestlark, Katherine K, NP  fluticasone (FLONASE) 50 MCG/ACT nasal spray Place 2 sprays into both nostrils daily. 11/28/15   Doreene Nestlark, Katherine K, NP  predniSONE (DELTASONE) 50 MG tablet Take one 50 mg tablet once daily for the next 5 days. 06/04/17   Orvil FeilWoods, Anival Pasha M, PA-C  traZODone (DESYREL)  50 MG tablet Take 0.5-1 tablets (25-50 mg total) by mouth at bedtime as needed for sleep. Patient not taking: Reported on 10/07/2015 01/16/15   Doreene Nest, NP    Allergies Patient has no known allergies.  Family History  Problem Relation Age of Onset  . Alcohol abuse Father   . Cancer Maternal Aunt   . Cancer Maternal  Grandfather     Social History Social History   Tobacco Use  . Smoking status: Current Every Day Smoker    Packs/day: 1.00    Types: Cigarettes  . Smokeless tobacco: Never Used  Substance Use Topics  . Alcohol use: No    Alcohol/week: 0.0 oz  . Drug use: No     Review of Systems  Constitutional: No fever/chills Eyes: No visual changes. No discharge ENT: No upper respiratory complaints. Cardiovascular: no chest pain. Respiratory: no cough. No SOB. Gastrointestinal: No abdominal pain.  No nausea, no vomiting.  No diarrhea.  No constipation. Musculoskeletal: Patient has low back pain. Skin: Negative for rash, abrasions, lacerations, ecchymosis. Neurological: Negative for headaches, focal weakness or numbness.   ____________________________________________   PHYSICAL EXAM:  VITAL SIGNS: ED Triage Vitals  Enc Vitals Group     BP 06/04/17 1843 (!) 128/97     Pulse Rate 06/04/17 1843 97     Resp 06/04/17 1843 16     Temp 06/04/17 1843 98.4 F (36.9 C)     Temp Source 06/04/17 1843 Oral     SpO2 06/04/17 1843 99 %     Weight 06/04/17 1842 150 lb (68 kg)     Height 06/04/17 1842 5\' 7"  (1.702 m)     Head Circumference --      Peak Flow --      Pain Score 06/04/17 1841 8     Pain Loc --      Pain Edu? --      Excl. in GC? --      Constitutional: Alert and oriented. Well appearing and in no acute distress. Eyes: Conjunctivae are normal. PERRL. EOMI. Head: Atraumatic. Cardiovascular: Normal rate, regular rhythm. Normal S1 and S2.  Good peripheral circulation. Respiratory: Normal respiratory effort without tachypnea or retractions. Lungs CTAB. Good air entry to the bases with no decreased or absent breath sounds. Gastrointestinal: Bowel sounds 4 quadrants. Soft and nontender to palpation. No guarding or rigidity. No palpable masses. No distention. No CVA tenderness. Musculoskeletal: Full range of motion to all extremities. No gross deformities  appreciated. Neurologic:  Normal speech and language. No gross focal neurologic deficits are appreciated.  Skin:  Skin is warm, dry and intact. No rash noted. Psychiatric: Mood and affect are normal. Speech and behavior are normal. Patient exhibits appropriate insight and judgement.   ____________________________________________   LABS (all labs ordered are listed, but only abnormal results are displayed)  Labs Reviewed - No data to display ____________________________________________  EKG   ____________________________________________  RADIOLOGY   No results found.  ____________________________________________    PROCEDURES  Procedure(s) performed:    Procedures    Medications  diazepam (VALIUM) tablet 2 mg (2 mg Oral Given 06/04/17 2005)  ketorolac (TORADOL) 30 MG/ML injection 30 mg (30 mg Intramuscular Given 06/04/17 2004)     ____________________________________________   INITIAL IMPRESSION / ASSESSMENT AND PLAN / ED COURSE  Pertinent labs & imaging results that were available during my care of the patient were reviewed by me and considered in my medical decision making (see chart for details).  Review of the   CSRS was performed in accordance of the [facility] prior to dispensing any controlled drugs.     Assessment and Plan:  Low back pain Patient presents to the emergency department with an exacerbation of chronic low back pain.  Patient received Ativan and Toradol in the emergency department.  She was discharged with another 5-day course of prednisone.  Patient was advised to complete physical therapy and to transfer her records from chiropractor as directed by neurosurgery.  All patient questions were answered.    ____________________________________________  FINAL CLINICAL IMPRESSION(S) / ED DIAGNOSES  Final diagnoses:  Acute bilateral low back pain with left-sided sciatica      NEW MEDICATIONS STARTED DURING THIS VISIT:  ED Discharge Orders         Ordered    predniSONE (DELTASONE) 50 MG tablet     06/04/17 2052          This chart was dictated using voice recognition software/Dragon. Despite best efforts to proofread, errors can occur which can change the meaning. Any change was purely unintentional.    Gasper Lloyd 06/04/17 2142    Jene Every, MD 06/04/17 2227

## 2017-06-04 NOTE — ED Triage Notes (Signed)
C/O lower back pain.  Seen through ED for same complaints in December, states was followed up with Ivar DrapeAmanda Ferri and was to have an outpatient MRI, but insurance did not approve.  Patient states she has been taking muscle relaxers, but pain returned a few days ago.

## 2017-06-25 ENCOUNTER — Ambulatory Visit
Admission: RE | Admit: 2017-06-25 | Discharge: 2017-06-25 | Disposition: A | Discharge status: Home / Self Care | Payer: Medicaid Other | Source: Ambulatory Visit | Attending: Student | Admitting: Student

## 2017-06-25 DIAGNOSIS — G8929 Other chronic pain: Secondary | ICD-10-CM | POA: Diagnosis present

## 2017-06-25 DIAGNOSIS — M549 Dorsalgia, unspecified: Secondary | ICD-10-CM

## 2017-06-25 DIAGNOSIS — M5126 Other intervertebral disc displacement, lumbar region: Secondary | ICD-10-CM | POA: Insufficient documentation

## 2017-06-25 DIAGNOSIS — M48061 Spinal stenosis, lumbar region without neurogenic claudication: Secondary | ICD-10-CM | POA: Diagnosis not present

## 2017-08-17 ENCOUNTER — Encounter: Payer: Self-pay | Admitting: Surgery

## 2017-08-17 ENCOUNTER — Ambulatory Visit (INDEPENDENT_AMBULATORY_CARE_PROVIDER_SITE_OTHER): Payer: Medicaid Other | Admitting: Surgery

## 2017-08-17 VITALS — BP 118/83 | HR 97 | Temp 96.9°F | Ht 67.0 in | Wt 153.0 lb

## 2017-08-17 DIAGNOSIS — K642 Third degree hemorrhoids: Secondary | ICD-10-CM | POA: Diagnosis not present

## 2017-08-17 NOTE — Patient Instructions (Signed)
You may try Miralax. This can be purchased at [facility] any drug store. Please be sure to increase water intake to 72 ounces daily.   You may also take Colace daily to avoid constipation.      Hemorrhoids Hemorrhoids are swollen veins in and around the rectum or anus. Hemorrhoids can cause pain, itching, or bleeding. Most of the time, they do not cause serious problems. They usually get better with diet changes, lifestyle changes, and other home treatments. Follow these instructions at home: Eating and drinking  Eat foods that have fiber, such as whole grains, beans, nuts, fruits, and vegetables. Ask your doctor about taking products that have added fiber (fibersupplements).  Drink enough fluid to keep your pee (urine) clear or pale yellow. For Pain and Swelling  Take a warm-water bath (sitz bath) for 20 minutes to ease pain. Do this 3-4 times a day.  If directed, put ice on the painful area. It may be helpful to use ice between your warm baths. ? Put ice in a plastic bag. ? Place a towel between your skin and the bag. ? Leave the ice on for 20 minutes, 2-3 times a day. General instructions  Take over-the-counter and prescription medicines only as told by your doctor. ? Medicated creams and medicines that are inserted into the anus (suppositories) may be used or applied as told.  Exercise often.  Go to the bathroom when you have the urge to poop (to have a bowel movement). Do not wait.  Avoid pushing too hard (straining) when you poop.  Keep the butt area dry and clean. Use wet toilet paper or moist paper towels.  Do not sit on the toilet for a long time. Contact a doctor if:  You have any of these: ? Pain and swelling that do not get better with treatment or medicine. ? Bleeding that will not stop. ? Trouble pooping or you cannot poop. ? Pain or swelling outside the area of the hemorrhoids. This information is not intended to replace advice given to you by your health  care provider. Make sure you discuss any questions you have with your health care provider. Document Released: 12/03/2007 Document Revised: 08/01/2015 Document Reviewed: 11/07/2014 Elsevier Interactive Patient Education  2018 [employer] Constipation, Adult Constipation is when a person:  Poops (has a bowel movement) fewer times in a week than normal.  Has a hard time pooping.  Has poop that is dry, hard, or bigger than normal.  Follow these instructions at home: Eating and drinking   Eat foods that have a lot of fiber, such as: ? Fresh fruits and vegetables. ? Whole grains. ? Beans.  Eat less of foods that are high in fat, low in fiber, or overly processed, such as: ? [place] fries. ? Hamburgers. ? Cookies. ? Candy. ? Soda.  Drink enough fluid to keep your pee (urine) clear or pale yellow. General instructions  Exercise regularly or as told by your doctor.  Go to the restroom when you feel like you need to poop. Do not hold it in.  Take over-the-counter and prescription medicines only as told by your doctor. These include any fiber supplements.  Do pelvic floor retraining exercises, such as: ? Doing deep breathing while relaxing your lower belly (abdomen). ? Relaxing your pelvic floor while pooping.  Watch your condition for any changes.  Keep all follow-up visits as told by your doctor. This is important. Contact a doctor if:  You have pain that gets worse.  You  have a fever.  You have not pooped for 4 days.  You throw up (vomit).  You are not hungry.  You lose weight.  You are bleeding from the anus.  You have thin, pencil-like poop (stool). Get help right away if:  You have a fever, and your symptoms suddenly get worse.  You leak poop or have blood in your poop.  Your belly feels hard or bigger than normal (is bloated).  You have very bad belly pain.  You feel dizzy or you faint. This information is not intended to replace advice given to  you by your health care provider. Make sure you discuss any questions you have with your health care provider. Document Released: 08/12/2007 Document Revised: 09/13/2015 Document Reviewed: 08/14/2015 Elsevier Interactive Patient Education  2018 Elsevier Inc.    High-Fiber Diet Fiber, also called dietary fiber, is a type of carbohydrate found in fruits, vegetables, whole grains, and beans. A high-fiber diet can have many health benefits. Your health care provider may recommend a high-fiber diet to help:  Prevent constipation. Fiber can make your bowel movements more regular.  Lower your cholesterol.  Relieve hemorrhoids, uncomplicated diverticulosis, or irritable bowel syndrome.  Prevent overeating as part of a weight-loss plan.  Prevent heart disease, type 2 diabetes, and certain cancers.  What is my plan? The recommended daily intake of fiber includes:  38 grams for men under age 21.  30 grams for men over age 21.  25 grams for women under age 59.  21 grams for women over age 33.  You can get the recommended daily intake of dietary fiber by eating a variety of fruits, vegetables, grains, and beans. Your health care provider may also recommend a fiber supplement if it is not possible to get enough fiber through your diet. What do I need to know about a high-fiber diet?  Fiber supplements have not been widely studied for their effectiveness, so it is better to get fiber through food sources.  Always check the fiber content on thenutrition facts label of any prepackaged food. Look for foods that contain at least 5 grams of fiber per serving.  Ask your dietitian if you have questions about specific foods that are related to your condition, especially if those foods are not listed in the following section.  Increase your daily fiber consumption gradually. Increasing your intake of dietary fiber too quickly may cause bloating, cramping, or gas.  Drink plenty of water. Water  helps you to digest fiber. What foods can I eat? Grains Whole-grain breads. Multigrain cereal. Oats and oatmeal. Brown rice. Barley. Bulgur wheat. Millet. Bran muffins. Popcorn. Rye wafer crackers. Vegetables Sweet potatoes. Spinach. Kale. Artichokes. Cabbage. Broccoli. Green peas. Carrots. Squash. Fruits Berries. Pears. Apples. Oranges. Avocados. Prunes and raisins. Dried figs. Meats and Other Protein Sources Navy, kidney, pinto, and soy beans. Split peas. Lentils. Nuts and seeds. Dairy Fiber-fortified yogurt. Beverages Fiber-fortified soy milk. Fiber-fortified orange juice. Other Fiber bars. The items listed above may not be a complete list of recommended foods or beverages. Contact your dietitian for more options. What foods are not recommended? Grains White bread. Pasta made with refined flour. White rice. Vegetables Fried potatoes. Canned vegetables. Well-cooked vegetables. Fruits Fruit juice. Cooked, strained fruit. Meats and Other Protein Sources Fatty cuts of meat. Fried Environmental education officer or fried fish. Dairy Milk. Yogurt. Cream cheese. Sour cream. Beverages Soft drinks. Other Cakes and pastries. Butter and oils. The items listed above may not be a complete list of foods and beverages  to avoid. Contact your dietitian for more information. What are some tips for including high-fiber foods in my diet?  Eat a wide variety of high-fiber foods.  Make sure that half of all grains consumed each day are whole grains.  Replace breads and cereals made from refined flour or white flour with whole-grain breads and cereals.  Replace white rice with brown rice, bulgur wheat, or millet.  Start the day with a breakfast that is high in fiber, such as a cereal that contains at least 5 grams of fiber per serving.  Use beans in place of meat in soups, salads, or pasta.  Eat high-fiber snacks, such as berries, raw vegetables, nuts, or popcorn. This information is not intended to replace  advice given to you by your health care provider. Make sure you discuss any questions you have with your health care provider. Document Released: 02/23/2005 Document Revised: 08/01/2015 Document Reviewed: 08/08/2013 Elsevier Interactive Patient Education  [employer].

## 2017-08-17 NOTE — Progress Notes (Signed)
Surgical Clinic History and Physical  Referring provider:  Doreene Nest, NP [address] [place], [place] [address]  HISTORY OF PRESENT ILLNESS (HPI):  37 y.o. female presents for evaluation of hemorrhoids. Patient reports she has experienced intermittent painful and protruding hemorrhoids x 14 years since the birth of her daughter, though more recently experienced increased peri-anal pain with a protruding bulge that also made it difficult for her to feel clean after wiping x past 2 years. She says her symptoms became worst 1 month ago, at which time she was told she had grade 3 prolapsed hemorrhoids (requiring manual non-spontaneous reduction), since which her swelling and pain have both essentially resolved. She also describes a history of IBD with alternating constipation and loose BM's, adding that "I know I don't eat right". Patient otherwise denies any blood per rectum or recent fever/chills, N/V, unintentional weight loss, CP, or SOB.  PAST MEDICAL HISTORY (PMH):  Past Medical History:  Diagnosis Date  . Abnormal Pap smear of cervix   . Acquired hypothyroidism 02/16/2014  . Anxiety and depression 07/29/2014  . Complication of anesthesia   . Depression   . Dyspnea 10/07/2015  . Essential hypertension 02/16/2014  . History of breast augmentation 2009  . HPV test positive   . Medical history non-contributory   . Nail complaint 10/07/2015  . Vaginal discharge 03/08/2015     PAST SURGICAL HISTORY [facility]):  Past Surgical History:  Procedure Laterality Date  . BREAST ENHANCEMENT SURGERY  2009  . COSMETIC SURGERY    . LAPAROSCOPIC BILATERAL SALPINGECTOMY Bilateral 08/28/2013   Procedure: LAPAROSCOPIC BILATERAL SALPINGECTOMY;  Surgeon: Willodean Rosenthal, MD;  Location: [facility];  Service: Gynecology;  Laterality: Bilateral;  . LEEP  2006     MEDICATIONS:  Prior to Admission medications   Medication Sig Start Date End Date Taking? Authorizing Provider  hyoscyamine (LEVSIN  SL) 0.125 MG SL tablet Place under the tongue. 08/06/16  Yes [provider]  ADDERALL XR 30 MG 24 hr capsule  07/28/17   [provider]  amphetamine-dextroamphetamine (ADDERALL) 20 MG tablet Take 1 tablet (20 mg total) by mouth 2 (two) times daily. 02/25/16   Doreene Nest, NP  amphetamine-dextroamphetamine (ADDERALL) 20 MG tablet Take 1 tablet (20 mg total) by mouth 2 (two) times daily. 02/25/16   Doreene Nest, NP  clonazePAM (KLONOPIN) 0.5 MG tablet Take 1 tablet by mouth once daily as needed for anxiety. 04/24/15   Doreene Nest, NP  clonazePAM [name]) 1 MG tablet  07/12/17   [provider]  doxepin (SINEQUAN) 10 MG capsule  07/12/17   [provider]  fluconazole (DIFLUCAN) 150 MG tablet Take 150 mg by mouth See admin instructions. 07/12/17   [provider]  fluticasone (FLONASE) 50 MCG/ACT nasal spray Place 2 sprays into both nostrils daily. 11/28/15   Doreene Nest, NP  gabapentin (NEURONTIN) 100 MG capsule TAKE 1-2 CAPSULES BY MOUTH EVERY 6 HOURS 07/12/17   [provider]  ketorolac (TORADOL) 10 MG tablet TAKE 1 TABLET BY MOUTH EVERY 4-6 HOURS. NOT TO EXCEED 4 TABS PER DAY 07/12/17   [provider]  metroNIDAZOLE (FLAGYL) 500 MG tablet TAKE 1 TABLET BY MOUTH THREE TIMES A DAY FOR 7 DAYS 08/04/17   [provider]  minocycline (MINOCIN,DYNACIN) 100 MG capsule TAKE 1 CAPSULE BY MOUTH TWICE A DAY FOR 10 DAYS 07/12/17   [provider]  tiZANidine (ZANAFLEX) 2 MG tablet Take by mouth.    [provider]  TRINTELLIX 10 MG TABS tablet  06/07/17   [provider]  valACYclovir (VALTREX) 1000 MG tablet Take 1,000 mg by mouth 3 (three) times daily. 07/12/17   [provider]     ALLERGIES:  No Known Allergies   SOCIAL HISTORY:  Social History   Socioeconomic History  . Marital status: Divorced    Spouse name: Not on file  . Number of children: Not on file  . Years of  education: Not on file  . Highest education level: Not on file  Occupational History  . Not on file  Social Needs  . Financial resource strain: Not on file  . Food insecurity:    Worry: Not on file    Inability: Not on file  . Transportation needs:    Medical: Not on file    Non-medical: Not on file  Tobacco Use  . Smoking status: Current Every Day Smoker    Packs/day: 1.00    Types: Cigarettes  . Smokeless tobacco: Never Used  Substance and Sexual Activity  . Alcohol use: No    Alcohol/week: 0.0 oz  . Drug use: No  . Sexual activity: Not Currently    Birth control/protection: Injection  Lifestyle  . Physical activity:    Days per week: Not on file    Minutes per session: Not on file  . Stress: Not on file  Relationships  . Social connections:    Talks on phone: Not on file    Gets together: Not on file    Attends religious service: Not on file    Active member of club or organization: Not on file    Attends meetings of clubs or organizations: Not on file    Relationship status: Not on file  . Intimate partner violence:    Fear of current or ex partner: Not on file    Emotionally abused: Not on file    Physically abused: Not on file    Forced sexual activity: Not on file  Other Topics Concern  . Not on file  Social History Narrative  . Not on file    The patient currently resides (home / rehab facility / nursing home): Home The patient normally is (ambulatory / bedbound): Ambulatory  FAMILY HISTORY:  Family History  Problem Relation Age of Onset  . Alcohol abuse Father   . Cancer Maternal Aunt   . Cancer Maternal Grandfather     Otherwise negative/non-contributory.  REVIEW OF SYSTEMS:  Constitutional: denies any other weight loss, fever, chills, or sweats  Eyes: denies any other vision changes, history of eye injury  ENT: denies sore throat, hearing problems  Respiratory: denies shortness of breath, wheezing  Cardiovascular: denies chest pain,  palpitations  Gastrointestinal: abdominal pain, N/V, and bowel function as per HPI Musculoskeletal: denies any other joint pains or cramps  Skin: Denies any other rashes or skin discolorations Neurological: denies any other headache, dizziness, weakness  Psychiatric: Denies any other depression, anxiety   All other review of systems were otherwise negative   VITAL SIGNS:  BP 118/83   Pulse 97   Temp (!) 96.9 F (36.1 C) (Oral)   Ht 5\' 7"  (1.702 m)   Wt 153 lb (69.4 kg)   BMI 23.96 kg/m    PHYSICAL EXAM:  Constitutional:  -- Normal body habitus  -- Awake, alert, and oriented x3  Eyes:  -- Pupils equally round and reactive to light  -- No scleral icterus  Ear, nose, throat:  -- No  jugular venous distension -- No nasal drainage, bleeding Pulmonary:  -- No crackles  -- Equal breath sounds bilaterally -- Breathing non-labored at rest Cardiovascular:  -- S1, S2 present  -- No pericardial rubs  Gastrointestinal:  -- Abdomen soft, nontender, non-distended, no guarding/rebound  -- No abdominal masses appreciated, pulsatile or otherwise  Anorectal: -- Currently no appreciable prolapsed hemorrhoids, fissure, or fistula -- No gross blood, anal sphincter tone WNL, no focal anorectal pain with exam beyond normal discomfort Musculoskeletal and Integumentary:  -- Wounds or skin discoloration: None appreciated -- Extremities: B/L UE and LE FROM, hands and feet warm, no edema  Neurologic:  -- Motor function: Intact and symmetric -- Sensation: Intact and symmetric  Labs:  CBC:  Lab Results  Component Value Date   WBC 6.7 08/28/2013   RBC 4.81 08/28/2013   BMP:  Lab Results  Component Value Date   GLUCOSE 105 (H) 07/03/2015   CO2 28 07/03/2015   BUN 10 07/03/2015   CREATININE 0.82 07/03/2015   CALCIUM 9.3 07/03/2015     Imaging studies: No new pertinent imaging studies available for review   Assessment/Plan:  37 y.o. female with recently symptomatic reportedly grade 3  non-bleeding internal hemorrhoids (requiring manual non-spontaneous reduction), complicated by co-morbidities including IBD, HTN, acquired hypothyroidism, generalized anxiety disorder, major depression disorder, and chronic ongoing tobacco abuse (smoking).   - discussed with patient importance of hydration and fiber for regularity of bowel function  - consider once daily stool softener to prevent constipation +/- Miralax prn to relieve constipation,   - offered GI referral to evaluate for IBS and return to clinic if hemorrhoidal symptoms recur  - smoking cessation also discussed and encouraged  - instructed to call if any questions or concerns  All of the above recommendations were discussed with the patient, and all of patient's questions were answered to her expressed satisfaction.  Thank you for the opportunity to participate in this patient's care.  -- Scherrie GerlachJason E. Earlene Plateravis, MD, RPVI [place]: [facility] - Partnering for exceptional care. Office: [phone]

## 2017-08-18 ENCOUNTER — Encounter: Payer: Self-pay | Admitting: Surgery

## 2017-09-03 ENCOUNTER — Telehealth: Payer: Self-pay | Admitting: Obstetrics & Gynecology

## 2017-09-03 NOTE — Telephone Encounter (Signed)
[facility] Abnormal pap. Called and left voicemail for patient to call back to be schedule

## 2017-09-03 NOTE — Telephone Encounter (Signed)
Patient schedule with RPH on 09/16/17

## 2017-09-16 ENCOUNTER — Ambulatory Visit: Payer: Medicaid Other | Admitting: Obstetrics & Gynecology

## 2017-09-16 ENCOUNTER — Other Ambulatory Visit (HOSPITAL_COMMUNITY)
Admission: RE | Admit: 2017-09-16 | Discharge: 2017-09-16 | Disposition: A | Discharge status: Home / Self Care | Payer: Medicaid Other | Source: Ambulatory Visit | Attending: Obstetrics & Gynecology | Admitting: Obstetrics & Gynecology

## 2017-09-16 ENCOUNTER — Encounter: Payer: Self-pay | Admitting: Obstetrics & Gynecology

## 2017-09-16 VITALS — BP 100/60 | Ht 67.0 in | Wt 151.0 lb

## 2017-09-16 DIAGNOSIS — R87610 Atypical squamous cells of undetermined significance on cytologic smear of cervix (ASC-US): Secondary | ICD-10-CM | POA: Insufficient documentation

## 2017-09-16 DIAGNOSIS — R232 Flushing: Secondary | ICD-10-CM

## 2017-09-16 DIAGNOSIS — R350 Frequency of micturition: Secondary | ICD-10-CM

## 2017-09-16 NOTE — Patient Instructions (Signed)

## 2017-09-16 NOTE — Progress Notes (Signed)
Referring Provider:  Dr Hilda BladesPolanco, [facility]  HPI:  Carol Castro is a 37 y.o.  [phone]  who presents today for evaluation and management of abnormal cervical cytology.    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 She has had occas PAP (but not consistently) over past 10 years    ROS:  Pertinent items noted in HPI and remainder of comprehensive ROS otherwise negative.  OB History  Gravida Para Term Preterm AB Living  5 1 1   4 1   SAB TAB Ectopic Multiple Live Births    4          # Outcome Date GA Lbr Len/2nd Weight Sex Delivery Anes PTL Lv  5 TAB 2012          4 Term 11/22/02   8 lb 7 oz (3.827 kg) F Vag-Spont     3 TAB           2 TAB           1 TAB             Past Medical History:  Diagnosis Date  . Abnormal Pap smear of cervix   . Acquired hypothyroidism 02/16/2014  . Anxiety and depression 07/29/2014  . Complication of anesthesia   . Depression   . Dyspnea 10/07/2015  . Essential hypertension 02/16/2014  . History of breast augmentation 2009  . HPV test positive   . Medical history non-contributory   . Nail complaint 10/07/2015  . Vaginal discharge 03/08/2015    Past Surgical History:  Procedure Laterality Date  . BREAST ENHANCEMENT SURGERY  2009  . COSMETIC SURGERY    . LAPAROSCOPIC BILATERAL SALPINGECTOMY Bilateral 08/28/2013   Procedure: LAPAROSCOPIC BILATERAL SALPINGECTOMY;  Surgeon: Willodean Rosenthalarolyn Harraway-Smith, MD;  Location: [facility];  Service: Gynecology;  Laterality: Bilateral;  . LEEP  2006    SOCIAL HISTORY: Social History   Substance and Sexual Activity  Alcohol Use No  . Alcohol/week: 0.0 oz   Social History   Substance and Sexual Activity  Drug Use No     Family History  Problem Relation Age of Onset  . Alcohol abuse Father   . Cancer Maternal Aunt   . Cancer Maternal Grandfather     ALLERGIES:  Patient has no known  allergies.  Current Outpatient Medications on File Prior to Visit  Medication Sig Dispense Refill  . ADDERALL XR 30 MG 24 hr capsule   0  . clonazePAM (KLONOPIN) 1 MG tablet   2  . fluticasone (FLONASE) 50 MCG/ACT nasal spray Place 2 sprays into both nostrils daily. 16 g 5  . hyoscyamine (LEVSIN SL) 0.125 MG SL tablet Place under the tongue.    Marland Kitchen. tiZANidine (ZANAFLEX) 2 MG tablet Take by mouth.    Marland Kitchen. amphetamine-dextroamphetamine (ADDERALL) 20 MG tablet Take 1 tablet (20 mg total) by mouth 2 (two) times daily. (Patient not taking: Reported on 09/16/2017) 60 tablet 0  . amphetamine-dextroamphetamine (ADDERALL) 20 MG tablet Take 1 tablet (20 mg total) by mouth 2 (two) times daily. (Patient not taking: Reported on 09/16/2017) 60 tablet 0  . clonazePAM (KLONOPIN) 0.5 MG tablet Take 1 tablet by mouth once daily as needed for anxiety. (Patient not taking: Reported on 09/16/2017) 30 tablet 0  . doxepin (SINEQUAN) 10 MG capsule   2  . fluconazole (DIFLUCAN) 150 MG tablet Take 150 mg by mouth See  admin instructions.  0  . gabapentin (NEURONTIN) 100 MG capsule TAKE 1-2 CAPSULES BY MOUTH EVERY 6 HOURS  0  . ketorolac (TORADOL) 10 MG tablet TAKE 1 TABLET BY MOUTH EVERY 4-6 HOURS. NOT TO EXCEED 4 TABS PER DAY  0  . metroNIDAZOLE (FLAGYL) 500 MG tablet TAKE 1 TABLET BY MOUTH THREE TIMES A DAY FOR 7 DAYS  1  . minocycline (MINOCIN,DYNACIN) 100 MG capsule TAKE 1 CAPSULE BY MOUTH TWICE A DAY FOR 10 DAYS  0  . TRINTELLIX 10 MG TABS tablet   2  . valACYclovir (VALTREX) 1000 MG tablet Take 1,000 mg by mouth 3 (three) times daily.  0   No current facility-administered medications on file prior to visit.    Physical Exam: -Vitals:  BP 100/60   Ht 5\' 7"  (1.702 m)   Wt 151 lb (68.5 kg)   LMP 09/09/2017   BMI 23.65 kg/m  GEN: WD, WN, NAD.  A+ O x 3, good mood and affect. ABD:  NT, ND.  Soft, no masses.  No hernias noted.   Pelvic:   Vulva: Normal appearance.  No lesions.  Vagina: No lesions or abnormalities  noted.  Support: Normal pelvic support.  Urethra No masses tenderness or scarring.  Meatus Normal size without lesions or prolapse.  Cervix: See below.  Anus: Normal exam.  No lesions.  Perineum: Normal exam.  No lesions.        Bimanual   Uterus: Normal size.  Non-tender.  Mobile.  AV.  Adnexae: No masses.  Non-tender to palpation.  Cul-de-sac: Negative for abnormality.   PROCEDURE: 1.  Urine Pregnancy Test:  not done 2.  Colposcopy performed with 4% acetic acid after verbal consent obtained                                         -Aceto-white Lesions Location(s): None.              -Biopsy performed at 12 o'clock               -ECC indicated and performed: Yes.       -Biopsy sites made hemostatic with pressure, AgNO3, and/or Monsel's solution   -Satisfactory colposcopy: Yes.      -Evidence of Invasive cervical CA :  NO  ASSESSMENT:  Carol Castro is a 37 y.o. [phone] here for  1. ASCUS of cervix with negative high risk HPV   2. Hot flashes   3. Frequency of urination    PLAN: 1.  I discussed the grading system of pap smears and HPV high risk viral types.  We will discuss and base management after colpo results return. 2. Follow up PAP 6 months, vs intervention if high grade dysplasia identified 3. Also, urinary freq and urge, U Culture done 4. Also, Hot flashes, still has periods most mos.  Prior BTL.  TSH today.     Annamarie Major, MD, [facility], [facility] 09/16/2017  3:13 PM

## 2017-09-17 LAB — TSH: TSH: 1.36 u[IU]/mL (ref 0.450–4.500)

## 2017-09-18 LAB — URINE CULTURE

## 2017-10-13 ENCOUNTER — Ambulatory Visit: Payer: Medicaid Other | Attending: Student

## 2017-10-13 ENCOUNTER — Other Ambulatory Visit: Payer: Self-pay

## 2017-10-13 DIAGNOSIS — M545 Low back pain, unspecified: Secondary | ICD-10-CM

## 2017-10-13 DIAGNOSIS — G8929 Other chronic pain: Secondary | ICD-10-CM | POA: Insufficient documentation

## 2017-10-13 DIAGNOSIS — M62830 Muscle spasm of back: Secondary | ICD-10-CM | POA: Diagnosis present

## 2017-10-13 DIAGNOSIS — M6281 Muscle weakness (generalized): Secondary | ICD-10-CM

## 2017-10-14 NOTE — Therapy (Signed)
[facility] [address], [place], [address] Phone: [phone]   Fax:  [phone]  Physical Therapy Evaluation  Patient Details  Name: Carol Castro MRN: [id number] Date of Birth: Oct 25, 1980 Referring Provider: Ivar Drape, PA   Encounter Date: 10/13/2017  PT End of Session - 10/13/17 1331    Visit Number  1    Number of Visits  13    Date for PT Re-Evaluation  11/24/17    Authorization Type  Medicaid    PT Start Time  1000    PT Stop Time  1100    PT Time Calculation (min)  60 min    Activity Tolerance  Patient tolerated treatment well    Behavior During Therapy  [facility] for tasks assessed/performed       Past Medical History:  Diagnosis Date  . Abnormal Pap smear of cervix   . Acquired hypothyroidism 02/16/2014  . Anxiety and depression 07/29/2014  . Complication of anesthesia   . Depression   . Dyspnea 10/07/2015  . Essential hypertension 02/16/2014  . History of breast augmentation 2009  . HPV test positive   . Medical history non-contributory   . Nail complaint 10/07/2015  . Vaginal discharge 03/08/2015    Past Surgical History:  Procedure Laterality Date  . BREAST ENHANCEMENT SURGERY  2009  . COSMETIC SURGERY    . LAPAROSCOPIC BILATERAL SALPINGECTOMY Bilateral 08/28/2013   Procedure: LAPAROSCOPIC BILATERAL SALPINGECTOMY;  Surgeon: Willodean Rosenthal, MD;  Location: [facility];  Service: Gynecology;  Laterality: Bilateral;  . LEEP  2006    There were no vitals filed for this visit.   Subjective Assessment - 10/13/17 1746    Subjective  Pt worked at [employer] 3 years ago and was carrying "something heavy" and felt an onset of immediate back pain and felt that "she could not move". Since then, pt has been having low back pain and pain radiating into both legs. Pt reports that back pain has significantly worsened over the past year and that she has had to make multiple ER visits  because of it. Pt has been getting epidurals for pain relieve starting approximately 1.5 months ago and is scheduled to receive another in about 1 month. Pt feels that the epidurals have helped decrease the pain going down her legs but that she is still experiencing significant low back pain and pain in her buttock.  Pt reports that the worst the pain has been in the past week was 7/10 (with bending) and is currently/at best 5/10. Pt has trouble sleeping at night and constantly has to reposition because she cannot get comfortable. Pt states that occasionally she will experience numbness in her feet and that they will simultaneously change colors. Pt currently works with people with disabilities and enjoys watching movies, swimming, and reading psychology articles.    Pertinent History  Patient reports no other medical condition    Limitations  Sitting;Lifting    How long can you sit comfortably?  cannot sit comfortably    Diagnostic tests  MRI (central protrusion along L4-L5)    Patient Stated Goals  decrease pain, be able to get comfortable, be able to exercise again    Currently in Pain?  Yes    Pain Score  5     Pain Location  Back    Pain Orientation  Lower    Pain Descriptors / Indicators  Aching    Pain Type  Chronic pain  Pain Onset  More than a month ago    Pain Frequency  Constant    Aggravating Factors   bending    Pain Relieving Factors  standing up, walking         [facility] PT Assessment - 10/13/17 1606      Assessment   Medical Diagnosis  LBP w/lumbar radiculopathy    Referring Provider  Ivar DrapeAmanda Ferri, PA    Onset Date/Surgical Date  10/14/14    Next MD Visit  --    Prior Therapy  No      Balance Screen   Has the patient fallen in the past 6 months  No    Has the patient had a decrease in activity level because of a fear of falling?   Yes    Is the patient reluctant to leave their home because of a fear of falling?   No      Prior Function   Level of Independence   Independent      Cognition   Overall Cognitive Status  Within Functional Limits for tasks assessed      Observation/Other Assessments   Focus on Therapeutic Outcomes (FOTO)   49      Functional Tests   Functional tests  Squat      Squat   Comments  significant knee translation over feet and little to no hip hinging       ROM / Strength   AROM / PROM / Strength  Strength;AROM      AROM   Overall AROM   Deficits    Overall AROM Comments  Decr lumbar flexion (50% limited), decr lumbar side bending (4" above knees B), no active extension       Strength   Overall Strength  Deficits    Strength Assessment Site  Hip    Right/Left Hip  Right;Left    Right Hip Flexion  3-/5    Right Hip Extension  3-/5    Right Hip External Rotation   3-/5    Right Hip Internal Rotation  4-/5    Right Hip ABduction  3-/5    Right Hip ADduction  3-/5    Left Hip Flexion  3-/5    Left Hip Extension  3-/5    Left Hip External Rotation  3-/5    Left Hip Internal Rotation  4-/5    Left Hip ABduction  3-/5    Left Hip ADduction  3-/5      Palpation   Spinal mobility  L1-L5 hypomobile (L4-L5 more painful than L1-L2)    Palpation comment  Tenderness along lumbar paraspinal musculature and gluteus maximus insertion on iliac crest bilateral       Special Tests    Special Tests  Lumbar    Lumbar Tests  Straight Leg Raise;Slump Test      Slump test   Findings  Negative    Side  Right    Comment  Lt      Straight Leg Raise   Findings  Negative    Side   Right    Comment  Lt        Objective measurements completed on examination: See above findings.    TREATMENT Therapeutic Exercise:  CKC lumbar extension -- x 20  Bridges -- x10     PT Education - 10/13/17 1329    Education Details  Patient educated on PT plan of care. Pt also educated on HEP (CKC lumbar extension in painfree range and bridge)  Person(s) Educated  Patient    Methods  Explanation;Demonstration;Handout     Comprehension  Verbalized understanding;Returned demonstration;Verbal cues required          PT Long Term Goals - 10/13/17 1749      PT LONG TERM GOAL #1   Title  Pt will be independent and compliant with HEP in order to maintain gains made in physical therapy and return to prior level of function.    Baseline  10/13/17: dependent with form and technique for HEP    Time  4    Period  Weeks    Status  New    Target Date  11/10/17      PT LONG TERM GOAL #2   Title  Pt will report worst pain in past week <5/10 in order to improve quality of life and return to prior level of function    Baseline  10/13/17: 7/10    Time  4    Period  Weeks    Status  New    Target Date  11/10/17      PT LONG TERM GOAL #3   Title  Pt will report worst pain in past week <3/10 in order to improve quality of life and return to prior level of function.    Baseline  10/13/17: 7/10    Time  6    Period  Weeks    Status  New    Target Date  11/24/17      PT LONG TERM GOAL #4   Title  Pt will improve FOTO from 46 to 61 in order to demonstrate improvements in functional independence and quality of life.    Baseline  10/13/17: 46    Time  6    Period  Weeks    Status  New    Target Date  11/24/17             Plan - 10/13/17 1757    Clinical Impression Statement  Pt is 37 y.o female with increased low back pain starting approximately 3 years ago (worse in the past year) s/p lifting heavy object at work. Pt presents with increased lumbar dysfunction indicated by decreased lumbar AROM, increased pain across lumbar spine and hypomobility of L1-L4. Pt presents with increased muscle spasms in lumbar paraspinal musculature and gluteal insertion on iliac crests bilaterally. Pt also demonstrates signficant hip weakness indicated by general 3-/5 with stength testing. Pt will benefit from skilled PT in order to return to prior level of function.    Clinical Presentation  Evolving    Clinical Decision Making   Moderate    Rehab Potential  Fair    PT Frequency  2x / week    PT Duration  6 weeks    PT Treatment/Interventions  ADLs/Self Care Home Management;Aquatic Therapy;Biofeedback;Cryotherapy;Lawyer;Therapeutic exercise;Therapeutic activities;Functional mobility training;Neuromuscular re-education;Patient/family education;Manual techniques;Passive range of motion;Dry needling    PT Next Visit Plan  assess feet, lumbar mobility exercises, gradual strengthening, core activation (TA)    PT Home Exercise Plan  See education section    Consulted and Agree with Plan of Care  Patient       Patient will benefit from skilled therapeutic intervention in order to improve the following deficits and impairments:  Increased muscle spasms, Hypomobility, Decreased range of motion, Decreased activity tolerance, Decreased strength, Pain, Impaired flexibility, Decreased mobility  Visit Diagnosis: Chronic bilateral low back pain without sciatica  Muscle weakness (generalized)  Muscle spasm of back  Problem List Patient Active Problem List   Diagnosis Date Noted  . ASCUS of cervix with negative high risk HPV 09/16/2017  . Dyspnea 10/07/2015  . Nail complaint 10/07/2015  . Attention deficit hyperactivity disorder (ADHD), predominantly inattentive type 05/23/2015  . Vaginal discharge 03/08/2015  . Insomnia 01/16/2015  . Tobacco abuse 10/02/2014  . Anxiety and depression 07/29/2014  . Frequent headaches 07/29/2014  . IBS (irritable bowel syndrome) 07/29/2014  . Acne vulgaris 07/29/2014  . Weight gain 07/29/2014  . Acquired hypothyroidism 02/16/2014  . DM II (diabetes mellitus, type II), controlled (HCC) 02/16/2014  . Essential hypertension 02/16/2014    Mellody Dance, SPT 10/14/2017, 11:05 AM  [facility] PHYS[facility] AND SPORTS MEDICINE [address] [place], [address] Phone: [phone]   Fax:   [phone]  Name: Carol Castro MRN: [id number] Date of Birth: 09-23-1980

## 2017-10-18 ENCOUNTER — Encounter: Payer: Self-pay | Admitting: Emergency Medicine

## 2017-10-18 ENCOUNTER — Emergency Department
Admission: EM | Admit: 2017-10-18 | Discharge: 2017-10-18 | Disposition: A | Discharge status: Home / Self Care | Payer: Medicaid Other | Attending: Emergency Medicine | Admitting: Emergency Medicine

## 2017-10-18 DIAGNOSIS — I10 Essential (primary) hypertension: Secondary | ICD-10-CM | POA: Diagnosis not present

## 2017-10-18 DIAGNOSIS — M5440 Lumbago with sciatica, unspecified side: Secondary | ICD-10-CM | POA: Insufficient documentation

## 2017-10-18 DIAGNOSIS — E119 Type 2 diabetes mellitus without complications: Secondary | ICD-10-CM | POA: Insufficient documentation

## 2017-10-18 DIAGNOSIS — F17210 Nicotine dependence, cigarettes, uncomplicated: Secondary | ICD-10-CM | POA: Insufficient documentation

## 2017-10-18 DIAGNOSIS — E039 Hypothyroidism, unspecified: Secondary | ICD-10-CM | POA: Diagnosis not present

## 2017-10-18 DIAGNOSIS — Z79899 Other long term (current) drug therapy: Secondary | ICD-10-CM | POA: Diagnosis not present

## 2017-10-18 DIAGNOSIS — M545 Low back pain: Secondary | ICD-10-CM | POA: Diagnosis present

## 2017-10-18 DIAGNOSIS — G8929 Other chronic pain: Secondary | ICD-10-CM

## 2017-10-18 MED ORDER — OXYCODONE-ACETAMINOPHEN 5-325 MG PO TABS
1.0000 | ORAL_TABLET | Freq: Once | ORAL | Status: AC
  Administered 2017-10-18: 1 via ORAL
  Filled 2017-10-18: qty 1

## 2017-10-18 MED ORDER — PREDNISONE 50 MG PO TABS: ORAL_TABLET | ORAL | 0 refills | Status: DC

## 2017-10-18 MED ORDER — ORPHENADRINE CITRATE 30 MG/ML IJ SOLN
60.0000 mg | Freq: Two times a day (BID) | INTRAMUSCULAR | Status: DC
  Administered 2017-10-18: 60 mg via INTRAVENOUS
  Filled 2017-10-18: qty 2

## 2017-10-18 MED ORDER — METHYLPREDNISOLONE SODIUM SUCC 125 MG IJ SOLR
125.0000 mg | Freq: Once | INTRAMUSCULAR | Status: AC
  Administered 2017-10-18: 125 mg via INTRAMUSCULAR
  Filled 2017-10-18: qty 2

## 2017-10-18 NOTE — ED Triage Notes (Signed)
Pt arrived with mother with complaints of severe lower back pain. Pt denies any knew injury but states since Friday the pain has been so severe it has debilitated pt and limited her ability to perform ADLS. Pt states she had an MRI on her back done Saturday and was prescribed gabapentin and muscle relaxer. Pt arrives today for pain management.

## 2017-10-18 NOTE — ED Provider Notes (Signed)
[facility] Emergency Department Provider Note  ____________________________________________  Time seen: Approximately 9:22 PM  I have reviewed the triage vital signs and the nursing notes.   HISTORY  Chief Complaint Back Pain    HPI Carol Castro is a 37 y.o. female with a history of chronic back pain presents to the emergency department with 10 out of 10 low back pain that "shoots up my spine".  Patient was seen at [facility] 2 days ago and underwent a MRI of her lumbar spine which revealed mild disc herniation but no stenosis or other concerning findings.  Patient was started empirically on gabapentin but reports that her pain continues to be severe.  She denies bowel or bladder incontinence or saddle anesthesia.  She has been receiving epidural injections by neurosurgery which have "not really been helping".  Patient would like to get established with a pain management facility.   Past Medical History:  Diagnosis Date  . Abnormal Pap smear of cervix   . Acquired hypothyroidism 02/16/2014  . Anxiety and depression 07/29/2014  . Complication of anesthesia   . Depression   . Dyspnea 10/07/2015  . Essential hypertension 02/16/2014  . History of breast augmentation 2009  . HPV test positive   . Medical history non-contributory   . Nail complaint 10/07/2015  . Vaginal discharge 03/08/2015    Patient Active Problem List   Diagnosis Date Noted  . ASCUS of cervix with negative high risk HPV 09/16/2017  . Dyspnea 10/07/2015  . Nail complaint 10/07/2015  . Attention deficit hyperactivity disorder (ADHD), predominantly inattentive type 05/23/2015  . Vaginal discharge 03/08/2015  . Insomnia 01/16/2015  . Tobacco abuse 10/02/2014  . Anxiety and depression 07/29/2014  . Frequent headaches 07/29/2014  . IBS (irritable bowel syndrome) 07/29/2014  . Acne vulgaris 07/29/2014  . Weight gain 07/29/2014  . Acquired hypothyroidism 02/16/2014  . DM II  (diabetes mellitus, type II), controlled (HCC) 02/16/2014  . Essential hypertension 02/16/2014    Past Surgical History:  Procedure Laterality Date  . BREAST ENHANCEMENT SURGERY  2009  . COSMETIC SURGERY    . LAPAROSCOPIC BILATERAL SALPINGECTOMY Bilateral 08/28/2013   Procedure: LAPAROSCOPIC BILATERAL SALPINGECTOMY;  Surgeon: Willodean Rosenthal, MD;  Location: [facility];  Service: Gynecology;  Laterality: Bilateral;  . LEEP  2006    Prior to Admission medications   Medication Sig Start Date End Date Taking? Authorizing Provider  ADDERALL XR 30 MG 24 hr capsule  07/28/17   [provider]  amphetamine-dextroamphetamine (ADDERALL) 20 MG tablet Take 1 tablet (20 mg total) by mouth 2 (two) times daily. Patient not taking: Reported on 09/16/2017 02/25/16   Doreene Nest, NP  amphetamine-dextroamphetamine (ADDERALL) 20 MG tablet Take 1 tablet (20 mg total) by mouth 2 (two) times daily. Patient not taking: Reported on 09/16/2017 02/25/16   Doreene Nest, NP  clonazePAM (KLONOPIN) 0.5 MG tablet Take 1 tablet by mouth once daily as needed for anxiety. 04/24/15   Doreene Nest, NP  clonazePAM [name]) 1 MG tablet  07/12/17   [provider]  doxepin (SINEQUAN) 10 MG capsule  07/12/17   [provider]  fluconazole (DIFLUCAN) 150 MG tablet Take 150 mg by mouth See admin instructions. 07/12/17   [provider]  fluticasone (FLONASE) 50 MCG/ACT nasal spray Place 2 sprays into both nostrils daily. Patient taking differently: Place 2 sprays into both nostrils as needed.  11/28/15   Doreene Nest, NP  gabapentin (NEURONTIN) 100 MG capsule TAKE 1-2  CAPSULES BY MOUTH EVERY 6 HOURS 07/12/17   [provider]  hyoscyamine (LEVSIN SL) 0.125 MG SL tablet Place under the tongue. 08/06/16   [provider]  ketorolac (TORADOL) 10 MG tablet TAKE 1 TABLET BY MOUTH EVERY 4-6 HOURS. NOT TO EXCEED 4 TABS PER DAY 07/12/17   [provider]   metroNIDAZOLE (FLAGYL) 500 MG tablet TAKE 1 TABLET BY MOUTH THREE TIMES A DAY FOR 7 DAYS 08/04/17   [provider]  minocycline (MINOCIN,DYNACIN) 100 MG capsule TAKE 1 CAPSULE BY MOUTH TWICE A DAY FOR 10 DAYS 07/12/17   [provider]  predniSONE (DELTASONE) 50 MG tablet Take one 50 mg tablet once daily for the next five days. 10/18/17   Orvil FeilWoods, Francheska Villeda M, PA-C  tiZANidine (ZANAFLEX) 2 MG tablet Take by mouth.    [provider]  TRINTELLIX 10 MG TABS tablet  06/07/17   [provider]  valACYclovir (VALTREX) 1000 MG tablet Take 1,000 mg by mouth 3 (three) times daily. 07/12/17   [provider]    Allergies Patient has no known allergies.  Family History  Problem Relation Age of Onset  . Alcohol abuse Father   . Cancer Maternal Aunt   . Cancer Maternal Grandfather     Social History Social History   Tobacco Use  . Smoking status: Current Every Day Smoker    Packs/day: 1.00    Types: Cigarettes  . Smokeless tobacco: Never Used  Substance Use Topics  . Alcohol use: No    Alcohol/week: 0.0 standard drinks  . Drug use: No     Review of Systems  Constitutional: No fever/chills Eyes: No visual changes. No discharge ENT: No upper respiratory complaints. Cardiovascular: no chest pain. Respiratory: no cough. No SOB. Gastrointestinal: No abdominal pain.  No nausea, no vomiting.  No diarrhea.  No constipation. Musculoskeletal: Patient has low back pain. Skin: Negative for rash, abrasions, lacerations, ecchymosis. Neurological: Negative for headaches, focal weakness or numbness.   ____________________________________________   PHYSICAL EXAM:  VITAL SIGNS: ED Triage Vitals  Enc Vitals Group     BP 10/18/17 1942 (!) 122/93     Pulse Rate 10/18/17 1942 84     Resp 10/18/17 1942 20     Temp 10/18/17 1942 98.5 F (36.9 C)     Temp Source 10/18/17 1942 Oral     SpO2 10/18/17 1942 98 %     Weight 10/18/17 1943 152 lb (68.9 kg)      Height 10/18/17 1943 5\' 7"  (1.702 m)     Head Circumference --      Peak Flow --      Pain Score 10/18/17 1942 10     Pain Loc --      Pain Edu? --      Excl. in GC? --      Constitutional: Alert and oriented. Well appearing and in no acute distress. Eyes: Conjunctivae are normal. PERRL. EOMI. Head: Atraumatic. Cardiovascular: Normal rate, regular rhythm. Normal S1 and S2.  Good peripheral circulation. Respiratory: Normal respiratory effort without tachypnea or retractions. Lungs CTAB. Good air entry to the bases with no decreased or absent breath sounds. Musculoskeletal: Full range of motion to all extremities. No gross deformities appreciated.  Patient has paraspinal muscle tenderness along the lumbar spine.  Positive straight leg raise bilaterally.  Palpable dorsalis pedis pulse bilaterally and symmetrically. Neurologic:  Normal speech and language. No gross focal neurologic deficits are appreciated.  Skin:  Skin is warm, dry and intact.  No rash noted. Psychiatric: Mood and affect are normal. Speech and behavior are normal. Patient exhibits appropriate insight and judgement.   ____________________________________________   LABS (all labs ordered are listed, but only abnormal results are displayed)  Labs Reviewed - No data to display ____________________________________________  EKG   ____________________________________________  RADIOLOGY   No results found.  ____________________________________________    PROCEDURES  Procedure(s) performed:    Procedures    Medications  orphenadrine (NORFLEX) injection 60 mg (60 mg Intravenous Given 10/18/17 2034)  oxyCODONE-acetaminophen (PERCOCET/ROXICET) 5-325 MG per tablet 1 tablet (1 tablet Oral Given 10/18/17 2032)  methylPREDNISolone sodium succinate (SOLU-MEDROL) 125 mg/2 mL injection 125 mg (125 mg Intramuscular Given 10/18/17 2033)     ____________________________________________   INITIAL IMPRESSION /  ASSESSMENT AND PLAN / ED COURSE  Pertinent labs & imaging results that were available during my care of the patient were reviewed by me and considered in my medical decision making (see chart for details).  Review of the [place] CSRS was performed in accordance of the [facility] prior to dispensing any controlled drugs.      Assessment and plan Chronic low back pain Patient presents to the emergency department with chronic low back pain.  I have evaluated patient multiple times in the past and reviewed patient's most current MRI with her.  I explained the lack of concerning findings on MRI.  Patient requested IV steroids in the emergency department.  IV Solu-Medrol was given as well as Norflex and 1 Percocet.  I cautioned patient that should she return to the emergency department in the future, she would not be given narcotic pain medication.  Patient's [facility].  Patient was discharged with a short course of prednisone and a referral to pain management was given at patient's request.  Vital signs are reassuring prior to discharge.  All patient questions were answered.    ____________________________________________  FINAL CLINICAL IMPRESSION(S) / ED DIAGNOSES  Final diagnoses:  Chronic bilateral low back pain with sciatica, sciatica laterality unspecified      NEW MEDICATIONS STARTED DURING THIS VISIT:  ED Discharge Orders         Ordered    predniSONE (DELTASONE) 50 MG tablet     10/18/17 2059              This chart was dictated using voice recognition software/Dragon. Despite best efforts to proofread, errors can occur which can change the meaning. Any change was purely unintentional.    Gasper LloydWoods, Zyire Eidson M, PA-C 10/18/17 2136    Phineas SemenGoodman, [place], MD 10/18/17 2138

## 2017-10-21 ENCOUNTER — Ambulatory Visit: Payer: Medicaid Other

## 2017-11-02 DIAGNOSIS — M5441 Lumbago with sciatica, right side: Secondary | ICD-10-CM

## 2017-11-02 DIAGNOSIS — G894 Chronic pain syndrome: Secondary | ICD-10-CM | POA: Insufficient documentation

## 2017-11-02 DIAGNOSIS — R937 Abnormal findings on diagnostic imaging of other parts of musculoskeletal system: Secondary | ICD-10-CM | POA: Insufficient documentation

## 2017-11-02 DIAGNOSIS — M79605 Pain in left leg: Secondary | ICD-10-CM

## 2017-11-02 DIAGNOSIS — Z789 Other specified health status: Secondary | ICD-10-CM | POA: Insufficient documentation

## 2017-11-02 DIAGNOSIS — M899 Disorder of bone, unspecified: Secondary | ICD-10-CM | POA: Insufficient documentation

## 2017-11-02 DIAGNOSIS — M5442 Lumbago with sciatica, left side: Secondary | ICD-10-CM

## 2017-11-02 DIAGNOSIS — M48061 Spinal stenosis, lumbar region without neurogenic claudication: Secondary | ICD-10-CM | POA: Insufficient documentation

## 2017-11-02 DIAGNOSIS — M79604 Pain in right leg: Secondary | ICD-10-CM

## 2017-11-02 DIAGNOSIS — G8929 Other chronic pain: Secondary | ICD-10-CM | POA: Insufficient documentation

## 2017-11-02 DIAGNOSIS — Z79899 Other long term (current) drug therapy: Secondary | ICD-10-CM | POA: Insufficient documentation

## 2017-11-02 NOTE — Patient Instructions (Addendum)
____________________________________________________________________________________________  Pain Scale  Introduction: The pain score used by this practice is the Verbal Numerical Rating Scale (VNRS-11). This is an 11-point scale. It is for adults and children 10 years or older. There are significant differences in how the pain score is reported, used, and applied. Forget everything you learned in the past and learn this scoring system.  General Information: The scale should reflect your current level of pain. Unless you are specifically asked for the level of your worst pain, or your average pain. If you are asked for one of these two, then it should be understood that it is over the past 24 hours.  Basic Activities of Daily Living (ADL): Personal hygiene, dressing, eating, transferring, and using restroom.  Instructions: Most patients tend to report their level of pain as a combination of two factors, their physical pain and their psychosocial pain. This last one is also known as "suffering" and it is reflection of how physical pain affects you socially and psychologically. From now on, report them separately. From this point on, when asked to report your pain level, report only your physical pain. Use the following table for reference.  Pain Clinic Pain Levels (0-5/10)  Pain Level Score  Description  No Pain 0   Mild pain 1 Nagging, annoying, but does not interfere with basic activities of daily living (ADL). Patients are able to eat, bathe, get dressed, toileting (being able to get on and off the toilet and perform personal hygiene functions), transfer (move in and out of bed or a chair without assistance), and maintain continence (able to control bladder and bowel functions). Blood pressure and heart rate are unaffected. A normal heart rate for a healthy adult ranges from 60 to 100 bpm (beats per minute).   Mild to moderate pain 2 Noticeable and distracting. Impossible to hide from other  people. More frequent flare-ups. Still possible to adapt and function close to normal. It can be very annoying and may have occasional stronger flare-ups. With discipline, patients may get used to it and adapt.   Moderate pain 3 Interferes significantly with activities of daily living (ADL). It becomes difficult to feed, bathe, get dressed, get on and off the toilet or to perform personal hygiene functions. Difficult to get in and out of bed or a chair without assistance. Very distracting. With effort, it can be ignored when deeply involved in activities.   Moderately severe pain 4 Impossible to ignore for more than a few minutes. With effort, patients may still be able to manage work or participate in some social activities. Very difficult to concentrate. Signs of autonomic nervous system discharge are evident: dilated pupils (mydriasis); mild sweating (diaphoresis); sleep interference. Heart rate becomes elevated (>115 bpm). Diastolic blood pressure (lower number) rises above 100 mmHg. Patients find relief in laying down and not moving.   Severe pain 5 Intense and extremely unpleasant. Associated with frowning face and frequent crying. Pain overwhelms the senses.  Ability to do any activity or maintain social relationships becomes significantly limited. Conversation becomes difficult. Pacing back and forth is common, as getting into a comfortable position is nearly impossible. Pain wakes you up from deep sleep. Physical signs will be obvious: pupillary dilation; increased sweating; goosebumps; brisk reflexes; cold, clammy hands and feet; nausea, vomiting or dry heaves; loss of appetite; significant sleep disturbance with inability to fall asleep or to remain asleep. When persistent, significant weight loss is observed due to the complete loss of appetite and sleep deprivation.  Blood   pressure and heart rate becomes significantly elevated. Caution: If elevated blood pressure triggers a pounding headache  associated with blurred vision, then the patient should immediately seek attention at an urgent or emergency care unit, as these may be signs of an impending stroke.    Emergency Department Pain Levels (6-10/10)  Emergency Room Pain 6 Severely limiting. Requires emergency care and should not be seen or managed at an outpatient pain management facility. Communication becomes difficult and requires great effort. Assistance to reach the emergency department may be required. Facial flushing and profuse sweating along with potentially dangerous increases in heart rate and blood pressure will be evident.   Distressing pain 7 Self-care is very difficult. Assistance is required to transport, or use restroom. Assistance to reach the emergency department will be required. Tasks requiring coordination, such as bathing and getting dressed become very difficult.   Disabling pain 8 Self-care is no longer possible. At this level, pain is disabling. The individual is unable to do even the most "basic" activities such as walking, eating, bathing, dressing, transferring to a bed, or toileting. Fine motor skills are lost. It is difficult to think clearly.   Incapacitating pain 9 Pain becomes incapacitating. Thought processing is no longer possible. Difficult to remember your own name. Control of movement and coordination are lost.   The worst pain imaginable 10 At this level, most patients pass out from pain. When this level is reached, collapse of the autonomic nervous system occurs, leading to a sudden drop in blood pressure and heart rate. This in turn results in a temporary and dramatic drop in blood flow to the brain, leading to a loss of consciousness. Fainting is one of the body's self defense mechanisms. Passing out puts the brain in a calmed state and causes it to shut down for a while, in order to begin the healing process.    Summary: 1. Refer to this scale when providing us with your pain level. 2. Be  accurate and careful when reporting your pain level. This will help with your care. 3. Over-reporting your pain level will lead to loss of credibility. 4. Even a level of 1/10 means that there is pain and will be treated at our facility. 5. High, inaccurate reporting will be documented as "Symptom Exaggeration", leading to loss of credibility and suspicions of possible secondary gains such as obtaining more narcotics, or wanting to appear disabled, for fraudulent reasons. 6. Only pain levels of 5 or below will be seen at our facility. 7. Pain levels of 6 and above will be sent to the Emergency Department and the appointment cancelled. ____________________________________________________________________________________________   ____________________________________________________________________________________________  Preparing for Procedure with Sedation  Instructions: . Oral Intake: Do not eat or drink anything for at least 8 hours prior to your procedure. . Transportation: Public transportation is not allowed. Bring an adult driver. The driver must be physically present in our waiting room before any procedure can be started. . Physical Assistance: Bring an adult physically capable of assisting you, in the event you need help. This adult should keep you company at home for at least 6 hours after the procedure. . Blood Pressure Medicine: Take your blood pressure medicine with a sip of water the morning of the procedure. . Blood thinners: Notify our staff if you are taking any blood thinners. Depending on which one you take, there will be specific instructions on how and when to stop it. . Diabetics on insulin: Notify the staff so that you can be   scheduled 1st case in the morning. If your diabetes requires high dose insulin, take only  of your normal insulin dose the morning of the procedure and notify the staff that you have done so. . Preventing infections: Shower with an antibacterial soap  the morning of your procedure. . Build-up your immune system: Take 1000 mg of Vitamin C with every meal (3 times a day) the day prior to your procedure. . Antibiotics: Inform the staff if you have a condition or reason that requires you to take antibiotics before dental procedures. . Pregnancy: If you are pregnant, call and cancel the procedure. . Sickness: If you have a cold, fever, or any active infections, call and cancel the procedure. . Arrival: You must be in the facility at least 30 minutes prior to your scheduled procedure. . Children: Do not bring children with you. . Dress appropriately: Bring dark clothing that you would not mind if they get stained. . Valuables: Do not bring any jewelry or valuables.  Procedure appointments are reserved for interventional treatments only. . No Prescription Refills. . No medication changes will be discussed during procedure appointments. . No disability issues will be discussed.  Reasons to call and reschedule or cancel your procedure: (Following these recommendations will minimize the risk of a serious complication.) . Surgeries: Avoid having procedures within 2 weeks of any surgery. (Avoid for 2 weeks before or after any surgery). . Flu Shots: Avoid having procedures within 2 weeks of a flu shots or . (Avoid for 2 weeks before or after immunizations). . Barium: Avoid having a procedure within 7-10 days after having had a radiological study involving the use of radiological contrast. (Myelograms, Barium swallow or enema study). . Heart attacks: Avoid any elective procedures or surgeries for the initial 6 months after a "Myocardial Infarction" (Heart Attack). . Blood thinners: It is imperative that you stop these medications before procedures. Let us know if you if you take any blood thinner.  . Infection: Avoid procedures during or within two weeks of an infection (including chest colds or gastrointestinal problems). Symptoms associated with  infections include: Localized redness, fever, chills, night sweats or profuse sweating, burning sensation when voiding, cough, congestion, stuffiness, runny nose, sore throat, diarrhea, nausea, vomiting, cold or Flu symptoms, recent or current infections. It is specially important if the infection is over the area that we intend to treat. . Heart and lung problems: Symptoms that may suggest an active cardiopulmonary problem include: cough, chest pain, breathing difficulties or shortness of breath, dizziness, ankle swelling, uncontrolled high or unusually low blood pressure, and/or palpitations. If you are experiencing any of these symptoms, cancel your procedure and contact your primary care physician for an evaluation.  Remember:  Regular Business hours are:  Monday to Thursday 8:00 AM to 4:00 PM  Provider's Schedule: Tana Trefry, MD:  Procedure days: Tuesday and Thursday 7:30 AM to 4:00 PM  Bilal Lateef, MD:  Procedure days: Monday and Wednesday 7:30 AM to 4:00 PM ____________________________________________________________________________________________    

## 2017-11-02 NOTE — Progress Notes (Signed)
Patient's Name: Elna Radovich  MRN: [id number]  Referring Provider: Marin Olp, PA-C  DOB: 1980-06-30  PCP: Nathaneil Canary, PA-C  DOS: 11/03/2017  Note by: Gaspar Cola, MD  Service setting: Ambulatory outpatient  Specialty: Interventional Pain Management  Location: [facility] (AMB) Pain Management Facility  Visit type: Initial Patient Evaluation  Patient type: New Patient   Primary Reason(s) for Visit: Encounter for initial evaluation of one or more chronic problems (new to examiner) potentially causing chronic pain, and posing a threat to normal musculoskeletal function. (Level of risk: High) CC: Back Pain  HPI  Ms. Kerman is a 37 y.o. year old, female patient, who comes today to see [place] for the first time for an initial evaluation of her chronic pain. She has Anxiety and depression; Frequent headaches; IBS (irritable bowel syndrome); Acne vulgaris; Weight gain; Tobacco abuse; Insomnia; Vaginal discharge; Attention deficit hyperactivity disorder (ADHD), predominantly inattentive type; Dyspnea; Nail complaint; Acquired hypothyroidism; DM II (diabetes mellitus, type II), controlled ([place]); Essential hypertension; ASCUS of cervix with negative high risk HPV; Chronic low back pain (Primary Area of Pain) (Bilateral) (R>L) w/ sciatica (Bilateral); Lumbar central spinal stenosis (L4-5); Abnormal MRI, lumbar spine (10/16/2017); Chronic lower extremity pain (Secondary Area of Pain) (Bilateral); Chronic pain syndrome; Long term prescription benzodiazepine use; Pharmacologic therapy; Disorder of skeletal system; Problems influencing health status; Weakness of lower extremities (Bilateral); Chronic lumbar radiculopathy; Lumbar facet syndrome (Bilateral) (R>L); Chronic hip pain (Secondary Area of Pain) (Bilateral) (R>L); Chronic feet pain (Tertiary Area of Pain) (Bilateral) (R>L); Lumbar radiculitis(L5) (Bilateral) (R>L); Coccyodynia; 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 Today she comes in for evaluation of her Back Pain  Pain Assessment: Location: Lower Back(back and botrh hips) Radiating: Pain radiaties down both hips down to foot, toes becomes tingling, tired, aching feeling Onset: More than a month ago Duration: Chronic pain Quality: Aching, Tingling, Dull, Discomfort, Constant Severity: 4 /10 (subjective, self-reported pain score)  Note: Reported level is compatible with observation.                         When using our objective Pain Scale, levels between 6 and 10/10 are said to belong in an emergency room, as it progressively worsens from a 6/10, described as severely limiting, requiring emergency care not usually available at an outpatient pain management facility. At a 6/10 level, communication becomes difficult and requires great effort. Assistance to reach the emergency department may be required. Facial flushing and profuse sweating along with potentially dangerous increases in heart rate and blood pressure will be evident. Effect on ADL: limits my Daily activities,moods, effect my household chores Timing: Constant Modifying factors: elevate legs, ice, rest, medications BP: 112/88  HR: 87  Onset and Duration: Date of injury: 05/2014 Cause of pain: Work related accident or event Severity: Getting worse, NAS-11 at its worse: 10/10, NAS-11 at its best: 4-5/10, NAS-11 now: 5/10 and NAS-11 on the average: 5/10 Timing: Not influenced by the time of the day Aggravating Factors: Bending, Kneeling, Lifiting, Prolonged sitting, Prolonged standing and Twisting Alleviating Factors: Cold packs, Lying down, Medications, Resting and Chiropractic manipulations Associated Problems: Color changes, Numbness, Swelling, Tingling, Weakness and Pain that does not allow patient to sleep Quality of Pain: Aching, Intermittent, Disabling, Dreadful, Exhausting, Getting longer, Sharp,  Shooting, Throbbing and Uncomfortable Previous Examinations or Tests: MRI scan, Neurological evaluation and Chiropractic evaluation Previous Treatments: Chiropractic manipulations, Epidural steroid injections  and Steroid treatments by mouth  The patient comes into the clinics today for the first time for a chronic pain management evaluation. Appointment to the patient and her primary pain is that of the lower back, bilaterally, with the right being worst on the left. The patient admits to having had physical therapy approximately 3-4 weeks ago, but she was not able to tolerate it due to her back pain. She denies any surgeries, but does admit to having had some injections done by Dr. Sharlet Salina. In reviewing the notes, I would seem that Dr. Sharlet Salina was doing L5 transforaminal epidural steroid injections. The patient indicates having had 3 of those.she also indicates that he provided her with relief of the pain that would last several days.  The patient's second area of pain is that of the hips, bilaterally, with the right being worst on the left. She denies any prior surgeries, x-rays, physical therapy, nerve blocks, or any other type of interventional therapies.  The patient's third area of pain is described to be that of her feet, bilaterally, with the pain being worse than the right compared to the left. She also complains of color changes but denies any changes in temperature. In the case of both feet, the pain pattern is one over the top of her foot and going into her big toe following the distribution of the L5 dermatome and/or superficial peroneal nerve. She denies any prior surgeries, trauma, x-rays, nerve conduction studies, or nerve blocks.  The patient's fourth area of pain is that of the lower extremities, bilaterally, with the right being worse than the left. She describes the pain in the right leg to go all the way down to the top of her foot with pain over the lateral and anterior portion of her  knee but most of the pain running to the back of her leg. In the case of the left lower extremity the distribution is similar, but not as intense.  Physical exam today was positive for bilateral lumbar facet syndrome, negative for SI joint or hip pathology. Her DTRs were diminished, but equal bilaterally over the patellar tendon and absent bilaterally for the Achilles tendon. The patient was able to toe walk and heel walk without any problems.  Today I took the time to provide the patient with information regarding my pain practice. The patient was informed that my practice is divided into two sections: an interventional pain management section, as well as a completely separate and distinct medication management section. I explained that I have procedure days for my interventional therapies, and evaluation days for follow-ups and medication management. Because of the amount of documentation required during both, they are kept separated. This means that there is the possibility that she may be scheduled for a procedure on one day, and medication management the next. I have also informed her that because of staffing and facility limitations, I no longer take patients for medication management only. To illustrate the reasons for this, I gave the patient the example of surgeons, and how inappropriate it would be to refer a patient to his/her care, just to write for the post-surgical antibiotics on a surgery done by a different surgeon.   Because interventional pain management is my board-certified specialty, the patient was informed that joining my practice means that they are open to any and all interventional therapies. I made it clear that this does not mean that they will be forced to have any procedures done. What this means is that I  believe interventional therapies to be essential part of the diagnosis and proper management of chronic pain conditions. Therefore, patients not interested in these  interventional alternatives will be better served under the care of a different practitioner.  The patient was also made aware of my Comprehensive Pain Management Safety Guidelines where by joining my practice, they limit all of their nerve blocks and joint injections to those done by our practice, for as long as we are retained to manage their care.   Historic Controlled Substance Pharmacotherapy Review  PMP and historical list of controlled substances: Adderall XR 30 mg; Dextroamphetamine/amphetamine 30 mg daily; clonazepam 1 mg twice a day Highest opioid analgesic regimen found: None  Most recent opioid analgesic: None  Current opioid analgesics: None  Highest recorded MME/day: 0 mg/day MME/day: 0 mg/day Medications: The patient did not bring the medication(s) to the appointment, as requested in our "New Patient Package" Pharmacodynamics: Desired effects: Analgesia: The patient reports >50% benefit. Reported improvement in function: The patient reports medication allows her to accomplish basic ADLs. Clinically meaningful improvement in function (CMIF): Sustained CMIF goals met Perceived effectiveness: Described as relatively effective, allowing for increase in activities of daily living (ADL) Undesirable effects: Side-effects or Adverse reactions: None reported Historical Monitoring: The patient  reports that she does not use drugs. List of all UDS Test(s): No results found for: MDMA, COCAINSCRNUR, [place], [place], CANNABQUANT, [place], [place] List of other Serum/Urine Drug Screening Test(s):  No results found for: AMPHSCRSER, BARBSCRSER, BENZOSCRSER, COCAINSCRSER, COCAINSCRNUR, PCPSCRSER, PCPQUANT, THCSCRSER, THCU, CANNABQUANT, OPIATESCRSER, OXYSCRSER, PROPOXSCRSER, ETH Historical Background Evaluation: [place] PMP: Six (6) year initial data search conducted.             PMP NARX Score Report:  Narcotic: 140 Sedative: 351 Stimulant: 70 [place] Department of public safety, offender search:  Editor, commissioning Information) Non-contributory Risk Assessment Profile: Aberrant behavior: None observed or detected today Risk factors for fatal opioid overdose: None identified today PMP NARX Overdose Risk Score: 040 Fatal overdose hazard ratio (HR): Calculation deferred Non-fatal overdose hazard ratio (HR): Calculation deferred Risk of opioid abuse or dependence: 0.7-3.0% with doses ? 36 MME/day and 6.1-26% with doses ? 120 MME/day. Substance use disorder (SUD) risk level: See below Personal History of Substance Abuse (SUD-Substance use disorder):  Alcohol: Negative  Illegal Drugs: Negative  Rx Drugs: Negative  ORT Risk Level calculation: Low Risk Opioid Risk Tool - 11/03/17 0829      Family History of Substance Abuse   Alcohol  Negative    Illegal Drugs  Negative    Rx Drugs  Negative      Personal History of Substance Abuse   Alcohol  Negative    Illegal Drugs  Negative    Rx Drugs  Negative      Age   Age between 78-45 years   Yes      History of Preadolescent Sexual Abuse   History of Preadolescent Sexual Abuse  Negative or Female      Psychological Disease   Psychological Disease  Negative    Depression  Negative      Total Score   Opioid Risk Tool Scoring  1    Opioid Risk Interpretation  Low Risk      ORT Scoring interpretation table:  Score <3 = Low Risk for SUD  Score between 4-7 = Moderate Risk for SUD  Score >8 = High Risk for Opioid Abuse   PHQ-2 Depression Scale:  Total score: 0  PHQ-2 Scoring interpretation table: (Score and probability of  major depressive disorder)  Score 0 = No depression  Score 1 = 15.4% Probability  Score 2 = 21.1% Probability  Score 3 = 38.4% Probability  Score 4 = 45.5% Probability  Score 5 = 56.4% Probability  Score 6 = 78.6% Probability   PHQ-9 Depression Scale:  Total score: 0  PHQ-9 Scoring interpretation table:  Score 0-4 = No depression  Score 5-9 = Mild depression  Score 10-14 = Moderate depression  Score 15-19 =  Moderately severe depression  Score 20-27 = Severe depression (2.4 times higher risk of SUD and 2.89 times higher risk of overuse)   Pharmacologic Plan: As per protocol, I have not taken over any controlled substance management, pending the results of ordered tests and/or consults.            Initial impression: Pending review of available data and ordered tests.  Meds   Current Outpatient Medications:  .  ADDERALL XR 30 MG 24 hr capsule, , Disp: , Rfl: 0 .  amphetamine-dextroamphetamine (ADDERALL) 20 MG tablet, Take 1 tablet (20 mg total) by mouth 2 (two) times daily., Disp: 60 tablet, Rfl: 0 .  clonazePAM (KLONOPIN) 0.5 MG tablet, Take 1 tablet by mouth once daily as needed for anxiety., Disp: 30 tablet, Rfl: 0 .  clonazePAM (KLONOPIN) 1 MG tablet, , Disp: , Rfl: 2 .  cyclobenzaprine (FLEXERIL) 10 MG tablet, Take 10 mg by mouth 3 (three) times daily as needed for muscle spasms., Disp: , Rfl:  .  fluticasone (FLONASE) 50 MCG/ACT nasal spray, Place 2 sprays into both nostrils daily. (Patient taking differently: Place 2 sprays into both nostrils as needed. ), Disp: 16 g, Rfl: 5 .  gabapentin (NEURONTIN) 100 MG capsule, TAKE 1-2 CAPSULES BY MOUTH EVERY 6 HOURS, Disp: , Rfl: 0 .  hyoscyamine (LEVSIN SL) 0.125 MG SL tablet, Place under the tongue., Disp: , Rfl:   Imaging Review  Lumbosacral Imaging: Lumbar MR wo contrast:  Results for orders placed during the hospital encounter of 06/25/17  MR LUMBAR SPINE WO CONTRAST   Narrative CLINICAL DATA:  Low back pain.  BILATERAL leg pain.  EXAM: MRI LUMBAR SPINE WITHOUT CONTRAST  TECHNIQUE: Multiplanar, multisequence MR imaging of the lumbar spine was performed. No intravenous contrast was administered.  COMPARISON:  None.  FINDINGS: Segmentation:  Standard.  Alignment:  Physiologic.  Vertebrae:  No fracture, evidence of discitis, or bone lesion.  Conus medullaris and cauda equina: Conus extends to the L1 level. Conus and cauda equina  appear normal.  Paraspinal and other soft tissues: Unremarkable.  Disc levels:  L1-L2:  Normal.  L2-L3:  Normal.  L3-L4: Disc desiccation. Annular bulge. Small facet joint effusions. No impingement.  L4-L5: Central disc extrusion. Slight caudal down turning. Mild to moderate stenosis. BILATERAL L5 nerve root impingement. No foraminal narrowing of significance.  L5-S1: Slight loss of disc height and signal. Annular bulge. No impingement.  IMPRESSION: Central disc extrusion at L4-5, slight caudal down turning. Mild to moderate stenosis. Either L5 nerve root could be affected.   Electronically Signed   By: Staci Righter M.D.   On: 06/25/2017 13:38    Complexity Note: Imaging results reviewed. Results shared with Ms. Guier, using Layman's terms. The patient had another lumbar MRI done at a [facility] facility on 10/16/2017. The results have been copied into the medical record under "abnormal MRI".                  ROS  Cardiovascular: High blood pressure Pulmonary or  Respiratory: Smoking and Coughing up mucus (Bronchitis) Neurological: No reported neurological signs or symptoms such as seizures, abnormal skin sensations, urinary and/or fecal incontinence, being born with an abnormal open spine and/or a tethered spinal cord Review of Past Neurological Studies: No results found for this or any previous visit. Psychological-Psychiatric: Anxiousness Gastrointestinal: Alternating episodes iof diarrhea and constipation (IBS-Irritable bowe syndrome) Genitourinary: No reported renal or genitourinary signs or symptoms such as difficulty voiding or producing urine, peeing blood, non-functioning kidney, kidney stones, difficulty emptying the bladder, difficulty controlling the flow of urine, or chronic kidney disease Hematological: No reported hematological signs or symptoms such as prolonged bleeding, low or poor functioning platelets, bruising or bleeding easily, hereditary bleeding problems,  low energy levels due to low hemoglobin or being anemic Endocrine: No reported endocrine signs or symptoms such as high or low blood sugar, rapid heart rate due to high thyroid levels, obesity or weight gain due to slow thyroid or thyroid disease Rheumatologic: No reported rheumatological signs and symptoms such as fatigue, joint pain, tenderness, swelling, redness, heat, stiffness, decreased range of motion, with or without associated rash Musculoskeletal: Negative for myasthenia gravis, muscular dystrophy, multiple sclerosis or malignant hyperthermia Work History: Out of work due to pain and Quit going to work on his/her own  Allergies  Ms. Regula has No Known Allergies.  Laboratory Chemistry  Inflammation Markers (CRP: Acute Phase) (ESR: Chronic Phase) No results found.  Rheumatology Markers No results found.  Renal Function Markers Lab Results  Component Value Date   BUN 13 11/03/2017   CREATININE 0.78 11/03/2017   GFRAA >60 11/03/2017   GFRNONAA >60 11/03/2017                             Hepatic Function Markers Lab Results  Component Value Date   AST 17 11/03/2017   ALT 15 11/03/2017   ALBUMIN 4.0 11/03/2017   ALKPHOS 43 11/03/2017   HCVAB NEGATIVE 01/16/2015                        Electrolytes Lab Results  Component Value Date   NA 139 11/03/2017   K 4.1 11/03/2017   CL 104 11/03/2017   CALCIUM 8.8 (L) 11/03/2017   MG 2.4 11/03/2017                        Neuropathy Markers Lab Results  Component Value Date   VITAMINB12 405 11/03/2017   HIV NONREACTIVE 01/16/2015                        Bone Pathology Markers No results found.  Coagulation Parameters Lab Results  Component Value Date   PLT 206 08/28/2013                        Cardiovascular Markers Lab Results  Component Value Date   HGB 14.3 08/28/2013   HCT 43.2 08/28/2013                         CA Markers No results found.  Note: Lab results reviewed.  PFSH  Drug: Ms. Fomby   reports that she does not use drugs. Alcohol:  reports that she does not drink alcohol. Tobacco:  reports that she has been smoking cigarettes. She has been smoking about 1.00 pack per day. She has  never used smokeless tobacco. Medical:  has a past medical history of Abnormal Pap smear of cervix, Acquired hypothyroidism (02/16/2014), Anxiety and depression (04/18/3126), Complication of anesthesia, Depression, Dyspnea (10/07/2015), Essential hypertension (02/16/2014), History of breast augmentation (2009), HPV test positive, Medical history non-contributory, Nail complaint (10/07/2015), and Vaginal discharge (03/08/2015). Family: family history includes Alcohol abuse in her father; Cancer in her maternal aunt and maternal grandfather.  Past Surgical History:  Procedure Laterality Date  . BREAST ENHANCEMENT SURGERY  2009  . COSMETIC SURGERY    . LAPAROSCOPIC BILATERAL SALPINGECTOMY Bilateral 08/28/2013   Procedure: LAPAROSCOPIC BILATERAL SALPINGECTOMY;  Surgeon: Lavonia Drafts, MD;  Location: [facility];  Service: Gynecology;  Laterality: Bilateral;  . LEEP  2006   Active Ambulatory Problems    Diagnosis Date Noted  . Anxiety and depression 07/29/2014  . Frequent headaches 07/29/2014  . IBS (irritable bowel syndrome) 07/29/2014  . Acne vulgaris 07/29/2014  . Weight gain 07/29/2014  . Tobacco abuse 10/02/2014  . Insomnia 01/16/2015  . Vaginal discharge 03/08/2015  . Attention deficit hyperactivity disorder (ADHD), predominantly inattentive type 05/23/2015  . Dyspnea 10/07/2015  . Nail complaint 10/07/2015  . Acquired hypothyroidism 02/16/2014  . DM II (diabetes mellitus, type II), controlled ([place]) 02/16/2014  . Essential hypertension 02/16/2014  . ASCUS of cervix with negative high risk HPV 09/16/2017  . Chronic low back pain (Primary Area of Pain) (Bilateral) (R>L) w/ sciatica (Bilateral) 11/02/2017  . Lumbar central spinal stenosis (L4-5) 11/02/2017  . Abnormal MRI, lumbar spine  (10/16/2017) 11/02/2017  . Chronic lower extremity pain (Secondary Area of Pain) (Bilateral) 11/02/2017  . Chronic pain syndrome 11/02/2017  . Long term prescription benzodiazepine use 11/02/2017  . Pharmacologic therapy 11/02/2017  . Disorder of skeletal system 11/02/2017  . Problems influencing health status 11/02/2017  . Weakness of lower extremities (Bilateral) 11/03/2017  . Chronic lumbar radiculopathy 11/03/2017  . Lumbar facet syndrome (Bilateral) (R>L) 11/03/2017  . Chronic hip pain (Secondary Area of Pain) (Bilateral) (R>L) 11/03/2017  . Chronic feet pain [facility] Area of Pain) (Bilateral) (R>L) 11/03/2017  . Lumbar radiculitis(L5) (Bilateral) (R>L) 11/03/2017  . Coccyodynia 11/03/2017  . 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 Spondylosis without myelopathy or radiculopathy, lumbar region 11/03/2017   Resolved Ambulatory Problems    Diagnosis Date Noted  . Bilateral lower abdominal pain 03/08/2015  . Nasal congestion 04/16/2015   Past Medical History:  Diagnosis Date  . Abnormal Pap smear of cervix   . Complication of anesthesia   . Depression   . History of breast augmentation 2009  . HPV test positive   . Medical history non-contributory    Constitutional Exam  General appearance: Well nourished, well developed, and well hydrated. In no apparent acute distress Vitals:   11/03/17 0813  BP: 112/88  Pulse: 87  Temp: 98.2 F (36.8 C)  SpO2: 99%  Weight: 150 lb (68 kg)  Height: '5\' 7"'  (1.702 m)   BMI Assessment: Estimated body mass index is 23.49 kg/m as calculated from the following:   Height as of this encounter: '5\' 7"'  (1.702 m).   Weight as of this encounter: 150 lb (68 kg).  BMI interpretation table: BMI level Category Range association with higher incidence of chronic pain  <18 kg/m2 Underweight   18.5-24.9 kg/m2 Ideal body weight   25-29.9 kg/m2 Overweight Increased incidence by 20%  30-34.9 kg/m2 Obese (Class I) Increased  incidence by 68%  35-39.9 kg/m2 Severe obesity (Class II) Increased incidence by 136%  >40 kg/m2 Extreme obesity (  Class III) Increased incidence by 254%   Patient's current BMI Ideal Body weight  Body mass index is 23.49 kg/m. Ideal body weight: 61.6 kg (135 lb 12.9 oz) Adjusted ideal body weight: 64.2 kg (141 lb 7.7 oz)   BMI Readings from Last 4 Encounters:  11/03/17 23.49 kg/m  10/18/17 23.81 kg/m  09/16/17 23.65 kg/m  08/17/17 23.96 kg/m   Wt Readings from Last 4 Encounters:  11/03/17 150 lb (68 kg)  10/18/17 152 lb (68.9 kg)  09/16/17 151 lb (68.5 kg)  08/17/17 153 lb (69.4 kg)  Psych/Mental status: Alert, oriented x 3 (person, place, & time)       Eyes: PERLA Respiratory: No evidence of acute respiratory distress  Cervical Spine Area Exam  Skin & Axial Inspection: No masses, redness, edema, swelling, or associated skin lesions Alignment: Symmetrical Functional ROM: Unrestricted ROM      Stability: No instability detected Muscle Tone/Strength: Functionally intact. No obvious neuro-muscular anomalies detected. Sensory (Neurological): Unimpaired Palpation: No palpable anomalies              Upper Extremity (UE) Exam    Side: Right upper extremity  Side: Left upper extremity  Skin & Extremity Inspection: Skin color, temperature, and hair growth are WNL. No peripheral edema or cyanosis. No masses, redness, swelling, asymmetry, or associated skin lesions. No contractures.  Skin & Extremity Inspection: Skin color, temperature, and hair growth are WNL. No peripheral edema or cyanosis. No masses, redness, swelling, asymmetry, or associated skin lesions. No contractures.  Functional ROM: Unrestricted ROM          Functional ROM: Unrestricted ROM          Muscle Tone/Strength: Functionally intact. No obvious neuro-muscular anomalies detected.  Muscle Tone/Strength: Functionally intact. No obvious neuro-muscular anomalies detected.  Sensory (Neurological): Unimpaired           Sensory (Neurological): Unimpaired          Palpation: No palpable anomalies              Palpation: No palpable anomalies              Provocative Test(s):  Phalen's test: deferred Tinel's test: deferred Apley's scratch test (touch opposite shoulder):  Action 1 (Across chest): deferred Action 2 (Overhead): deferred Action 3 (LB reach): deferred   Provocative Test(s):  Phalen's test: deferred Tinel's test: deferred Apley's scratch test (touch opposite shoulder):  Action 1 (Across chest): deferred Action 2 (Overhead): deferred Action 3 (LB reach): deferred    Thoracic Spine Area Exam  Skin & Axial Inspection: No masses, redness, or swelling Alignment: Symmetrical Functional ROM: Unrestricted ROM Stability: No instability detected Muscle Tone/Strength: Functionally intact. No obvious neuro-muscular anomalies detected. Sensory (Neurological): Unimpaired Muscle strength & Tone: No palpable anomalies  Lumbar Spine Area Exam  Skin & Axial Inspection: No masses, redness, or swelling Alignment: Symmetrical Functional ROM: Decreased ROM       Stability: No instability detected Muscle Tone/Strength: Functionally intact. No obvious neuro-muscular anomalies detected. Sensory (Neurological): Movement-associated pain Palpation: Complains of area being tender to palpation       Provocative Tests: Hyperextension/rotation test: (+) bilaterally for facet joint pain. Lumbar quadrant test (Kemp's test): (+) bilaterally for facet joint pain. Lateral bending test: deferred today       Patrick's Maneuver: (-) for bilateral S-I arthralgia and for bilateral hip arthralgia FABER test: (-) for bilateral S-I arthralgia and for bilateral hip arthralgia S-I anterior distraction/compression test: deferred today  S-I lateral compression test: deferred today         S-I Thigh-thrust test: deferred today         S-I Gaenslen's test: deferred today          Gait & Posture Assessment  Ambulation:  Unassisted Gait: Relatively normal for age and body habitus Posture: WNL   Lower Extremity Exam    Side: Right lower extremity  Side: Left lower extremity  Stability: No instability observed          Stability: No instability observed          Skin & Extremity Inspection: Skin color, temperature, and hair growth are WNL. No peripheral edema or cyanosis. No masses, redness, swelling, asymmetry, or associated skin lesions. No contractures.  Skin & Extremity Inspection: Skin color, temperature, and hair growth are WNL. No peripheral edema or cyanosis. No masses, redness, swelling, asymmetry, or associated skin lesions. No contractures.  Functional ROM: Unrestricted ROM                  Functional ROM: Unrestricted ROM                  Muscle Tone/Strength: Able to Toe-walk & Heel-walk without problems  Muscle Tone/Strength: Able to Toe-walk & Heel-walk without problems  Sensory (Neurological): Dermatomal pain pattern (L5)  Sensory (Neurological): Dermatomal pain pattern (L5)  Palpation: No palpable anomalies  Palpation: No palpable anomalies   Assessment  Primary Diagnosis & Pertinent Problem List: The primary encounter diagnosis was Chronic pain syndrome. Diagnoses of Chronic low back pain (Primary Area of Pain) (Bilateral) w/ sciatica (Bilateral), Lumbar central spinal stenosis (L4-5), Chronic lower extremity pain (Secondary Area of Pain) (Bilateral), Chronic lumbar radiculopathy, Weakness of lower extremities (Bilateral), Long term prescription benzodiazepine use, Pharmacologic therapy, Disorder of skeletal system, Problems influencing health status, Abnormal MRI, lumbar spine (06/25/2017), Lumbar facet syndrome (Bilateral) (R>L), Chronic hip pain (Secondary Area of Pain) (Bilateral) (R>L), Chronic feet pain (Tertiary Area of Pain) (Bilateral) (R>L), Lumbar radiculitis(L5) (Bilateral) (R>L), Coccyodynia, 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  Visit Diagnosis (New problems to examiner): 1. Chronic pain syndrome   2. Chronic low back pain (Primary Area of Pain) (Bilateral) w/ sciatica (Bilateral)   3. Lumbar central spinal stenosis (L4-5)   4. Chronic lower extremity pain (Secondary Area of Pain) (Bilateral)   5. Chronic lumbar radiculopathy   6. Weakness of lower extremities (Bilateral)   7. Long term prescription benzodiazepine use   8. Pharmacologic therapy   9. Disorder of skeletal system   10. Problems influencing health status   11. Abnormal MRI, lumbar spine (06/25/2017)   12. Lumbar facet syndrome (Bilateral) (R>L)   13. Chronic hip pain (Secondary Area of Pain) (Bilateral) (R>L)   14. Chronic feet pain (Tertiary Area of Pain) (Bilateral) (R>L)   15. Lumbar radiculitis(L5) (Bilateral) (R>L)   16. Coccyodynia   17. 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 Spondylosis without myelopathy or radiculopathy, lumbar region     Time Note: Greater than 50% of the 40 minute(s) of face-to-face time spent with Ms. Yeagle, was spent in counseling/coordination of care regarding: the appropriate use of the pain scale, Ms. Nielsen's primary cause of pain, the treatment plan, treatment alternatives, the risks and possible complications of proposed treatment, realistic expectations, the goals of pain management (increased in functionality) and the need to collect and read the AVS material.  Plan of  Care (Initial workup plan)  Note: Ms. Rossmann was reminded that as per protocol, today's visit has been an evaluation only. We have not taken over the patient's controlled substance management.  Problem-specific plan: No problem-specific Assessment & Plan notes found for this encounter.   Lab Orders     Compliance Drug Analysis, Ur     Comp. Metabolic Panel (12)     Magnesium     Vitamin B12     Sedimentation rate     25-Hydroxyvitamin D Lcms D2+D3      C-reactive protein  Imaging Orders     DG HIP UNILAT W OR W/O PELVIS 2-3 VIEWS RIGHT     DG HIP UNILAT W OR W/O PELVIS 2-3 VIEWS LEFT     DG Foot Complete Left     DG Foot Complete Right Referral Orders  No referral(s) requested today    Procedure Orders     LUMBAR FACET(MEDIAL BRANCH NERVE BLOCK) MBNB Pharmacotherapy (current): Medications ordered:  No orders of the defined types were placed in this encounter.  Medications administered during this visit: Skip Mayer had no medications administered during this visit.   Pharmacological management options:  Opioid Analgesics: The patient was informed that there is no guarantee that she would be a candidate for opioid analgesics. The decision will be made following CDC guidelines. This decision will be based on the results of diagnostic studies, as well as Ms. Levit's risk profile.   Membrane stabilizer: To be determined at a later time  Muscle relaxant: To be determined at a later time  NSAID: To be determined at a later time  Other analgesic(s): To be determined at a later time   Interventional management options: Ms. Tasso was informed that there is no guarantee that she would be a candidate for interventional therapies. The decision will be based on the results of diagnostic studies, as well as Ms. Crumm's risk profile.  Procedure(s) under consideration:  We will schedule the patient for a diagnostic bilateral lumbar facet block #1 under fluoroscopic guidance and IV sedation. Possible bilateral lumbar facet RFA  Diagnostic bilateral L4-5 transforaminal LESI  Diagnostic (Midline) L4-5 interlaminar LESI  Diagnostic caudal ESI + diagnostic epidurogram    Provider-requested follow-up: Return for Procedure (w/ sedation): (B) L-FCT BLK #1.  Future Appointments  Date Time Provider [place]  11/10/2017  1:00 PM Blythe Stanford, PT [facility] None  11/15/2017  3:45 PM Blythe Stanford, PT [facility] None  11/17/2017   2:30 PM Blythe Stanford, PT [facility] None  11/23/2017  1:45 PM Rissell, Lake Bells, PT [facility] None    Primary Care Physician: Nathaneil Canary, PA-C Location: [facility] Note by: Gaspar Cola, MD Date: 11/03/2017; Time: 10:02 AM

## 2017-11-03 ENCOUNTER — Other Ambulatory Visit
Admission: RE | Admit: 2017-11-03 | Discharge: 2017-11-03 | Disposition: A | Discharge status: Home / Self Care | Payer: Medicaid Other | Source: Ambulatory Visit | Attending: Pain Medicine | Admitting: Pain Medicine

## 2017-11-03 ENCOUNTER — Other Ambulatory Visit: Payer: Self-pay

## 2017-11-03 ENCOUNTER — Ambulatory Visit (HOSPITAL_BASED_OUTPATIENT_CLINIC_OR_DEPARTMENT_OTHER): Payer: Medicaid Other | Admitting: Pain Medicine

## 2017-11-03 ENCOUNTER — Encounter: Payer: Self-pay | Admitting: Pain Medicine

## 2017-11-03 VITALS — BP 112/88 | HR 87 | Temp 98.2°F | Ht 67.0 in | Wt 150.0 lb

## 2017-11-03 DIAGNOSIS — L700 Acne vulgaris: Secondary | ICD-10-CM | POA: Insufficient documentation

## 2017-11-03 DIAGNOSIS — M47816 Spondylosis without myelopathy or radiculopathy, lumbar region: Secondary | ICD-10-CM

## 2017-11-03 DIAGNOSIS — M79675 Pain in left toe(s): Secondary | ICD-10-CM

## 2017-11-03 DIAGNOSIS — K589 Irritable bowel syndrome without diarrhea: Secondary | ICD-10-CM | POA: Insufficient documentation

## 2017-11-03 DIAGNOSIS — Z7951 Long term (current) use of inhaled steroids: Secondary | ICD-10-CM | POA: Insufficient documentation

## 2017-11-03 DIAGNOSIS — E119 Type 2 diabetes mellitus without complications: Secondary | ICD-10-CM | POA: Insufficient documentation

## 2017-11-03 DIAGNOSIS — M79674 Pain in right toe(s): Secondary | ICD-10-CM | POA: Diagnosis not present

## 2017-11-03 DIAGNOSIS — R531 Weakness: Secondary | ICD-10-CM | POA: Insufficient documentation

## 2017-11-03 DIAGNOSIS — G8929 Other chronic pain: Secondary | ICD-10-CM | POA: Diagnosis present

## 2017-11-03 DIAGNOSIS — M5416 Radiculopathy, lumbar region: Secondary | ICD-10-CM

## 2017-11-03 DIAGNOSIS — Z720 Tobacco use: Secondary | ICD-10-CM | POA: Diagnosis not present

## 2017-11-03 DIAGNOSIS — I10 Essential (primary) hypertension: Secondary | ICD-10-CM | POA: Diagnosis not present

## 2017-11-03 DIAGNOSIS — M25552 Pain in left hip: Secondary | ICD-10-CM | POA: Diagnosis present

## 2017-11-03 DIAGNOSIS — M9983 Other biomechanical lesions of lumbar region: Secondary | ICD-10-CM

## 2017-11-03 DIAGNOSIS — M533 Sacrococcygeal disorders, not elsewhere classified: Secondary | ICD-10-CM

## 2017-11-03 DIAGNOSIS — Z789 Other specified health status: Secondary | ICD-10-CM

## 2017-11-03 DIAGNOSIS — F419 Anxiety disorder, unspecified: Secondary | ICD-10-CM | POA: Insufficient documentation

## 2017-11-03 DIAGNOSIS — F329 Major depressive disorder, single episode, unspecified: Secondary | ICD-10-CM | POA: Insufficient documentation

## 2017-11-03 DIAGNOSIS — M4726 Other spondylosis with radiculopathy, lumbar region: Secondary | ICD-10-CM | POA: Insufficient documentation

## 2017-11-03 DIAGNOSIS — M25551 Pain in right hip: Secondary | ICD-10-CM | POA: Diagnosis present

## 2017-11-03 DIAGNOSIS — Z79899 Other long term (current) drug therapy: Secondary | ICD-10-CM

## 2017-11-03 DIAGNOSIS — G894 Chronic pain syndrome: Secondary | ICD-10-CM | POA: Diagnosis not present

## 2017-11-03 DIAGNOSIS — G4700 Insomnia, unspecified: Secondary | ICD-10-CM | POA: Insufficient documentation

## 2017-11-03 DIAGNOSIS — M5442 Lumbago with sciatica, left side: Secondary | ICD-10-CM | POA: Diagnosis not present

## 2017-11-03 DIAGNOSIS — M48061 Spinal stenosis, lumbar region without neurogenic claudication: Secondary | ICD-10-CM | POA: Insufficient documentation

## 2017-11-03 DIAGNOSIS — F909 Attention-deficit hyperactivity disorder, unspecified type: Secondary | ICD-10-CM | POA: Insufficient documentation

## 2017-11-03 DIAGNOSIS — R937 Abnormal findings on diagnostic imaging of other parts of musculoskeletal system: Secondary | ICD-10-CM

## 2017-11-03 DIAGNOSIS — M79604 Pain in right leg: Secondary | ICD-10-CM | POA: Diagnosis not present

## 2017-11-03 DIAGNOSIS — M79605 Pain in left leg: Secondary | ICD-10-CM

## 2017-11-03 DIAGNOSIS — E039 Hypothyroidism, unspecified: Secondary | ICD-10-CM | POA: Diagnosis not present

## 2017-11-03 DIAGNOSIS — M899 Disorder of bone, unspecified: Secondary | ICD-10-CM

## 2017-11-03 DIAGNOSIS — M5441 Lumbago with sciatica, right side: Secondary | ICD-10-CM

## 2017-11-03 DIAGNOSIS — R29898 Other symptoms and signs involving the musculoskeletal system: Secondary | ICD-10-CM

## 2017-11-03 LAB — MAGNESIUM: MAGNESIUM: 2.4 mg/dL (ref 1.7–2.4)

## 2017-11-03 LAB — COMPREHENSIVE METABOLIC PANEL
ALT: 15 U/L (ref 0–44)
ANION GAP: 6 (ref 5–15)
AST: 17 U/L (ref 15–41)
Albumin: 4 g/dL (ref 3.5–5.0)
Alkaline Phosphatase: 43 U/L (ref 38–126)
BUN: 13 mg/dL (ref 6–20)
CHLORIDE: 104 mmol/L (ref 98–111)
CO2: 29 mmol/L (ref 22–32)
Calcium: 8.8 mg/dL — ABNORMAL LOW (ref 8.9–10.3)
Creatinine, Ser: 0.78 mg/dL (ref 0.44–1.00)
GFR calc non Af Amer: >60 mL/min (ref >60)
Glucose, Bld: 104 mg/dL — ABNORMAL HIGH (ref 70–99)
POTASSIUM: 4.1 mmol/L (ref 3.5–5.1)
Sodium: 139 mmol/L (ref 135–145)
Total Bilirubin: 0.9 mg/dL (ref 0.3–1.2)
Total Protein: 6.9 g/dL (ref 6.5–8.1)

## 2017-11-03 LAB — C-REACTIVE PROTEIN

## 2017-11-03 LAB — VITAMIN B12: VITAMIN B 12: 405 pg/mL (ref 180–914)

## 2017-11-03 LAB — SEDIMENTATION RATE: SED RATE: 2 mm/h (ref 0–20)

## 2017-11-04 ENCOUNTER — Ambulatory Visit: Payer: Medicaid Other

## 2017-11-04 LAB — VITAMIN D 25 HYDROXY (VIT D DEFICIENCY, FRACTURES): VIT D 25 HYDROXY: 28.4 ng/mL — AB (ref 30.0–100.0)

## 2017-11-07 LAB — COMPLIANCE DRUG ANALYSIS, UR

## 2017-11-10 ENCOUNTER — Ambulatory Visit: Payer: Medicaid Other | Attending: Student

## 2017-11-15 ENCOUNTER — Ambulatory Visit: Payer: Medicaid Other

## 2017-11-16 ENCOUNTER — Telehealth: Payer: Self-pay | Admitting: *Deleted

## 2017-11-17 ENCOUNTER — Telehealth: Payer: Self-pay | Admitting: Pain Medicine

## 2017-11-17 NOTE — Telephone Encounter (Signed)
Patient lvmail asking about test results and wants to update her med list as one of them is wrong.

## 2017-11-17 NOTE — Telephone Encounter (Signed)
Please advise or call to inform patient please.

## 2017-11-17 NOTE — Telephone Encounter (Signed)
You have given her accurate information there is no more advised to provide

## 2017-11-17 NOTE — Telephone Encounter (Signed)
Called patient and she would like to discuss with someone the results of her lab work. She looked at [employer] and states that she saw several labs abnormal and that she tested positive for cocaine. She states that it must be a false reading because she does not do any illegal drugs. She also states that she is currently taking Flonase, and currently went to the ED for increased pain. Patient says that she also had a spinal injection from Dr Yves Dill and was wondering if this could be the reason of the results.   I told the patient that at the next evaluation that Dr Shireen Quan would discuss all the lab work and Lobbyist. She does not have a appointment to come back still waitng to get test done.   Please advise or call the patient to inform.

## 2017-11-19 ENCOUNTER — Ambulatory Visit
Admission: RE | Admit: 2017-11-19 | Discharge: 2017-11-19 | Disposition: A | Discharge status: Home / Self Care | Payer: Medicaid Other | Source: Ambulatory Visit | Attending: Pain Medicine | Admitting: Pain Medicine

## 2017-11-19 DIAGNOSIS — G8929 Other chronic pain: Secondary | ICD-10-CM | POA: Diagnosis present

## 2017-11-19 DIAGNOSIS — M24852 Other specific joint derangements of left hip, not elsewhere classified: Secondary | ICD-10-CM | POA: Insufficient documentation

## 2017-11-19 DIAGNOSIS — M25551 Pain in right hip: Secondary | ICD-10-CM | POA: Insufficient documentation

## 2017-11-19 DIAGNOSIS — M24851 Other specific joint derangements of right hip, not elsewhere classified: Secondary | ICD-10-CM | POA: Diagnosis not present

## 2017-11-19 DIAGNOSIS — M79674 Pain in right toe(s): Secondary | ICD-10-CM

## 2017-11-19 DIAGNOSIS — M25552 Pain in left hip: Secondary | ICD-10-CM | POA: Insufficient documentation

## 2017-11-19 DIAGNOSIS — M79675 Pain in left toe(s): Secondary | ICD-10-CM

## 2017-11-23 ENCOUNTER — Ambulatory Visit: Payer: Medicaid Other

## 2017-11-30 ENCOUNTER — Encounter: Payer: Self-pay | Admitting: Obstetrics & Gynecology

## 2017-11-30 ENCOUNTER — Ambulatory Visit: Payer: Medicaid Other | Admitting: Obstetrics & Gynecology

## 2017-11-30 VITALS — BP 112/80 | HR 91 | Ht 67.0 in | Wt 152.0 lb

## 2017-11-30 DIAGNOSIS — N9419 Other specified dyspareunia: Secondary | ICD-10-CM

## 2017-11-30 DIAGNOSIS — N926 Irregular menstruation, unspecified: Secondary | ICD-10-CM | POA: Diagnosis not present

## 2017-11-30 NOTE — Patient Instructions (Signed)
Endometrial Ablation Endometrial ablation is a procedure that destroys the thin inner layer of the lining of the uterus (endometrium). This procedure may be done:  To stop heavy periods.  To stop bleeding that is causing anemia.  To control irregular bleeding.  To treat bleeding caused by small tumors (fibroids) in the endometrium.  This procedure is often an alternative to major surgery, such as removal of the uterus and cervix (hysterectomy). As a result of this procedure:  You may not be able to have children. However, if you are premenopausal (you have not gone through menopause): ? You may still have a small chance of getting pregnant. ? You will need to use a reliable method of birth control after the procedure to prevent pregnancy.  You may stop having a menstrual period, or you may have only a small amount of bleeding during your period. Menstruation may return several years after the procedure.  Tell a health care provider about:  Any allergies you have.  All medicines you are taking, including vitamins, herbs, eye drops, creams, and over-the-counter medicines.  Any problems you or family members have had with the use of anesthetic medicines.  Any blood disorders you have.  Any surgeries you have had.  Any medical conditions you have. What are the risks? Generally, this is a safe procedure. However, problems may occur, including:  A hole (perforation) in the uterus or bowel.  Infection of the uterus, bladder, or vagina.  Bleeding.  Damage to other structures or organs.  An air bubble in the lung (air embolus).  Problems with pregnancy after the procedure.  Failure of the procedure.  Decreased ability to diagnose cancer in the endometrium.  What happens before the procedure?  You will have tests of your endometrium to make sure there are no pre-cancerous cells or cancer cells present.  You may have an ultrasound of the uterus.  You may be given  medicines to thin the endometrium.  Ask your health care provider about: ? Changing or stopping your regular medicines. This is especially important if you take diabetes medicines or blood thinners. ? Taking medicines such as aspirin and ibuprofen. These medicines can thin your blood. Do not take these medicines before your procedure if your doctor tells you not to.  Plan to have someone take you home from the hospital or clinic. What happens during the procedure?  You will lie on an exam table with your feet and legs supported as in a pelvic exam.  To lower your risk of infection: ? Your health care team will wash or sanitize their hands and put on germ-free (sterile) gloves. ? Your genital area will be washed with soap.  An IV tube will be inserted into one of your veins.  You will be given a medicine to help you relax (sedative).  A surgical instrument with a light and camera (resectoscope) will be inserted into your vagina and moved into your uterus. This allows your surgeon to see inside your uterus.  Endometrial tissue will be removed using one of the following methods: ? Radiofrequency. This method uses a radiofrequency-alternating electric current to remove the endometrium. ? Cryotherapy. This method uses extreme cold to freeze the endometrium. ? Heated-free liquid. This method uses a heated saltwater (saline) solution to remove the endometrium. ? Microwave. This method uses high-energy microwaves to heat up the endometrium and remove it. ? Thermal balloon. This method involves inserting a catheter with a balloon tip into the uterus. The balloon tip is   filled with heated fluid to remove the endometrium. The procedure may vary among health care providers and hospitals. What happens after the procedure?  Your blood pressure, heart rate, breathing rate, and blood oxygen level will be monitored until the medicines you were given have worn off.  As tissue healing occurs, you may  notice vaginal bleeding for 4-6 weeks after the procedure. You may also experience: ? Cramps. ? Thin, watery vaginal discharge that is light pink or brown in color. ? A need to urinate more frequently than usual. ? Nausea.  Do not drive for 24 hours if you were given a sedative.  Do not have sex or insert anything into your vagina until your health care provider approves. Summary  Endometrial ablation is done to treat the many causes of heavy menstrual bleeding.  The procedure may be done only after medications have been tried to control the bleeding.  Plan to have someone take you home from the hospital or clinic. This information is not intended to replace advice given to you by your health care provider. Make sure you discuss any questions you have with your health care provider. Document Released: 01/03/2004 Document Revised: 03/12/2016 Document Reviewed: 03/12/2016 Elsevier Interactive Patient Education  2017 Elsevier Inc.  

## 2017-11-30 NOTE — Progress Notes (Signed)
Abdominal Pain Patient presents for evaluation of abdominal pain. The pain is described as cramping, sharp and shooting, and is 5/10 in intensity. Pain is located in the suprapubic area without radiation. Onset was intermittent occurring several months ago. Symptoms have been unchanged since. Aggravating factors: sex. Alleviating factors: none. Associated symptoms: irreg bleeding this last 3 mos w freq menses some mos. The patient denies constipation, diarrhea, fever, headache, nausea and vomiting. Risk factors for pelvic/abdominal pain include none.  Prior Dx Lap for CPP 2016, neg for endometrisois or cysts or sig adhesive dz.  Additional Complaints: Dysfunctional Uterine Bleeding Patient complains of irregular menses. She had been bleeding irregularly. She is now bleeding every few days and menses are lasting several days. She changes her pad or tampon every a few hours. Clots are medium in size. Dysmenorrhea:moderate, occurring throughout menses. Cyclic symptoms include: none. Current contraception: tubal ligation. History of infertility: no. History of abnormal Pap smear: yes - being followed.  PMHx: She  has a past medical history of Abnormal Pap smear of cervix, Acquired hypothyroidism (02/16/2014), Anxiety and depression (07/29/2014), Complication of anesthesia, Depression, Dyspnea (10/07/2015), Essential hypertension (02/16/2014), Herniated intervertebral disc of lumbar spine, History of breast augmentation (2009), HPV test positive, Medical history non-contributory, Nail complaint (10/07/2015), and Vaginal discharge (03/08/2015). Also,  has a past surgical history that includes LEEP (2006); Cosmetic surgery; Breast enhancement surgery (2009); and Laparoscopic bilateral salpingectomy (Bilateral, 08/28/2013)., family history includes Alcohol abuse in her father; Cancer in her maternal aunt and maternal grandfather.,  reports that she has been smoking cigarettes. She has been smoking about 1.00 pack per  day. She has never used smokeless tobacco. She reports that she does not drink alcohol or use drugs.  She has a current medication list which includes the following prescription(s): adderall xr, amphetamine-dextroamphetamine, clonazepam, fluticasone, gabapentin, hyoscyamine, ketorolac, and cyclobenzaprine. Also, has No Known Allergies.  Review of Systems  Constitutional: Positive for malaise/fatigue. Negative for chills and fever.  HENT: Negative for congestion, sinus pain and sore throat.   Eyes: Negative for blurred vision and pain.  Respiratory: Negative for cough and wheezing.   Cardiovascular: Negative for chest pain and leg swelling.  Gastrointestinal: Negative for abdominal pain, constipation, diarrhea, heartburn, nausea and vomiting.  Genitourinary: Negative for dysuria, frequency, hematuria and urgency.  Musculoskeletal: Negative for back pain, joint pain, myalgias and neck pain.  Skin: Negative for itching and rash.  Neurological: Negative for dizziness, tremors and weakness.  Endo/Heme/Allergies: Does not bruise/bleed easily.  Psychiatric/Behavioral: Negative for depression. The patient is not nervous/anxious and does not have insomnia.    Objective: BP 112/80   Pulse 91   Ht 5\' 7"  (1.702 m)   Wt 152 lb (68.9 kg)   LMP 11/30/2017 (Exact Date)   BMI 23.81 kg/m  Physical Exam  Constitutional: She is oriented to person, place, and time. She appears well-developed and well-nourished. No distress.  Musculoskeletal: Normal range of motion.  Neurological: She is alert and oriented to person, place, and time.  Skin: Skin is warm and dry.  Psychiatric: She has a normal mood and affect.  Vitals reviewed.  ASSESSMENT/PLAN:   Problem List Items Addressed This Visit      Other   Dyspareunia due to medical condition in female - Primary   Relevant Orders   US PELVIS TRANSVANGINAL NON-OB (TV ONLY)   Irregular menses   Relevant Orders   US PELVIS TRANSVANGINAL NON-OB (TV ONLY)     Ablation as non-hormonal option discussed, info given; can help  w bleeding and its associated sx's Unlikely endometriosis based on prior surgery results Hormone options discussed. Prefers no IUD.  Consider pill, Depo, Nexplanon.  Prior Colpo, due for PAP Jan 2020    ASCUS 2019    LEEP 2009  Annamarie Major, MD, [facility], [facility] 11/30/2017  2:35 PM

## 2017-12-06 ENCOUNTER — Encounter: Payer: Self-pay | Admitting: Obstetrics & Gynecology

## 2017-12-06 ENCOUNTER — Ambulatory Visit: Payer: Medicaid Other | Admitting: Obstetrics & Gynecology

## 2017-12-06 ENCOUNTER — Ambulatory Visit (INDEPENDENT_AMBULATORY_CARE_PROVIDER_SITE_OTHER): Payer: Medicaid Other

## 2017-12-06 VITALS — BP 102/70 | Ht 67.0 in | Wt 152.0 lb

## 2017-12-06 DIAGNOSIS — N8301 Follicular cyst of right ovary: Secondary | ICD-10-CM

## 2017-12-06 DIAGNOSIS — N9419 Other specified dyspareunia: Secondary | ICD-10-CM

## 2017-12-06 DIAGNOSIS — N921 Excessive and frequent menstruation with irregular cycle: Secondary | ICD-10-CM | POA: Diagnosis not present

## 2017-12-06 DIAGNOSIS — N926 Irregular menstruation, unspecified: Secondary | ICD-10-CM

## 2017-12-06 DIAGNOSIS — N946 Dysmenorrhea, unspecified: Secondary | ICD-10-CM

## 2017-12-06 NOTE — Progress Notes (Signed)
  HPI: Pt has irreg heavy painful cycles and also has dyspareunia, this has been chronic but also seems to be worsening over the last several mos.  [place] today to assess for any etiology.  Ultrasound demonstrates no masses seen These findings are Pelvis normal  PMHx: She  has a past medical history of Abnormal Pap smear of cervix, Acquired hypothyroidism (02/16/2014), Anxiety and depression (07/29/2014), Complication of anesthesia, Depression, Dyspnea (10/07/2015), Essential hypertension (02/16/2014), Herniated intervertebral disc of lumbar spine, History of breast augmentation (2009), HPV test positive, Medical history non-contributory, Nail complaint (10/07/2015), and Vaginal discharge (03/08/2015). Also,  has a past surgical history that includes LEEP (2006); Cosmetic surgery; Breast enhancement surgery (2009); and Laparoscopic bilateral salpingectomy (Bilateral, 08/28/2013)., family history includes Alcohol abuse in her father; Cancer in her maternal aunt and maternal grandfather.,  reports that she has been smoking cigarettes. She has been smoking about 1.00 pack per day. She has never used smokeless tobacco. She reports that she does not drink alcohol or use drugs.  She has a current medication list which includes the following prescription(s): adderall xr, amphetamine-dextroamphetamine, clonazepam, cyclobenzaprine, fluticasone, gabapentin, hyoscyamine, and ketorolac. Also, has No Known Allergies.  Review of Systems  All other systems reviewed and are negative.  Objective: BP 102/70   Ht 5\' 7"  (1.702 m)   Wt 152 lb (68.9 kg)   LMP 11/30/2017 (Exact Date)   BMI 23.81 kg/m   Physical examination Constitutional NAD, Conversant  Skin No rashes, lesions or ulceration.   Extremities: Moves all appropriately.  Normal ROM for age. No lymphadenopathy.  Neuro: Grossly intact  Psych: Oriented to PPT.  Normal mood. Normal affect.   Assessment:  Dysmenorrhea and Metrorrhagia Treatment option for  menorrhagia or menometrorrhagia discussed in great detail with the patient.  Options include hormonal therapy, IUD therapy such as Mirena, D&C, Ablation, and Hysterectomy.  The pros and cons of each option discussed with patient. Pt does not desire IUD, Nexplanon, or Depo as has been on in past w side effects.  Not candidate for estrogen based on age/smoking history.  Does not feel she can/wants to quit.  Ablation and hysterectomy discussed in detail today. Info provided.  H/o LEEP, abd PAP, and she says she is leaning towards hysterectomy to solve this issue as well.  Annamarie Major, MD, [facility], [facility] 12/06/2017  11:59 AM

## 2017-12-06 NOTE — Patient Instructions (Signed)
Endometrial Ablation Endometrial ablation is a procedure that destroys the thin inner layer of the lining of the uterus (endometrium). This procedure may be done:  To stop heavy periods.  To stop bleeding that is causing anemia.  To control irregular bleeding.  To treat bleeding caused by small tumors (fibroids) in the endometrium.  This procedure is often an alternative to major surgery, such as removal of the uterus and cervix (hysterectomy). As a result of this procedure:  You may not be able to have children. However, if you are premenopausal (you have not gone through menopause): ? You may still have a small chance of getting pregnant. ? You will need to use a reliable method of birth control after the procedure to prevent pregnancy.  You may stop having a menstrual period, or you may have only a small amount of bleeding during your period. Menstruation may return several years after the procedure.  Tell a health care provider about:  Any allergies you have.  All medicines you are taking, including vitamins, herbs, eye drops, creams, and over-the-counter medicines.  Any problems you or family members have had with the use of anesthetic medicines.  Any blood disorders you have.  Any surgeries you have had.  Any medical conditions you have. What are the risks? Generally, this is a safe procedure. However, problems may occur, including:  A hole (perforation) in the uterus or bowel.  Infection of the uterus, bladder, or vagina.  Bleeding.  Damage to other structures or organs.  An air bubble in the lung (air embolus).  Problems with pregnancy after the procedure.  Failure of the procedure.  Decreased ability to diagnose cancer in the endometrium.  What happens before the procedure?  You will have tests of your endometrium to make sure there are no pre-cancerous cells or cancer cells present.  You may have an ultrasound of the uterus.  You may be given  medicines to thin the endometrium.  Ask your health care provider about: ? Changing or stopping your regular medicines. This is especially important if you take diabetes medicines or blood thinners. ? Taking medicines such as aspirin and ibuprofen. These medicines can thin your blood. Do not take these medicines before your procedure if your doctor tells you not to.  Plan to have someone take you home from the hospital or clinic. What happens during the procedure?  You will lie on an exam table with your feet and legs supported as in a pelvic exam.  To lower your risk of infection: ? Your health care team will wash or sanitize their hands and put on germ-free (sterile) gloves. ? Your genital area will be washed with soap.  An IV tube will be inserted into one of your veins.  You will be given a medicine to help you relax (sedative).  A surgical instrument with a light and camera (resectoscope) will be inserted into your vagina and moved into your uterus. This allows your surgeon to see inside your uterus.  Endometrial tissue will be removed using one of the following methods: ? Radiofrequency. This method uses a radiofrequency-alternating electric current to remove the endometrium. ? Cryotherapy. This method uses extreme cold to freeze the endometrium. ? Heated-free liquid. This method uses a heated saltwater (saline) solution to remove the endometrium. ? Microwave. This method uses high-energy microwaves to heat up the endometrium and remove it. ? Thermal balloon. This method involves inserting a catheter with a balloon tip into the uterus. The balloon tip is   filled with heated fluid to remove the endometrium. The procedure may vary among health care providers and hospitals. What happens after the procedure?  Your blood pressure, heart rate, breathing rate, and blood oxygen level will be monitored until the medicines you were given have worn off.  As tissue healing occurs, you may  notice vaginal bleeding for 4-6 weeks after the procedure. You may also experience: ? Cramps. ? Thin, watery vaginal discharge that is light pink or brown in color. ? A need to urinate more frequently than usual. ? Nausea.  Do not drive for 24 hours if you were given a sedative.  Do not have sex or insert anything into your vagina until your health care provider approves. Summary  Endometrial ablation is done to treat the many causes of heavy menstrual bleeding.  The procedure may be done only after medications have been tried to control the bleeding.  Plan to have someone take you home from the hospital or clinic. This information is not intended to replace advice given to you by your health care provider. Make sure you discuss any questions you have with your health care provider. Document Released: 01/03/2004 Document Revised: 03/12/2016 Document Reviewed: 03/12/2016 Elsevier Interactive Patient Education  2017 Elsevier Inc.  

## 2017-12-21 ENCOUNTER — Encounter: Payer: Self-pay | Admitting: Obstetrics and Gynecology

## 2018-02-01 ENCOUNTER — Other Ambulatory Visit: Payer: Self-pay | Admitting: Pain Medicine

## 2018-02-01 DIAGNOSIS — E559 Vitamin D deficiency, unspecified: Secondary | ICD-10-CM

## 2018-02-01 MED ORDER — GNP CALCIUM 1200 1200-1000 MG-UNIT PO CHEW: 1200.0000 mg | CHEWABLE_TABLET | Freq: Every day | ORAL | 5 refills | Status: DC

## 2018-02-01 MED ORDER — ERGOCALCIFEROL 1.25 MG (50000 UT) PO CAPS: 50000.0000 [IU] | ORAL_CAPSULE | ORAL | 0 refills | Status: AC

## 2018-02-01 MED ORDER — VITAMIN D3 125 MCG (5000 UT) PO CAPS: 1.0000 | ORAL_CAPSULE | Freq: Every day | ORAL | 5 refills | Status: AC

## 2018-02-01 MED ORDER — MAGNESIUM 500 MG PO CAPS: 500.0000 mg | ORAL_CAPSULE | Freq: Two times a day (BID) | ORAL | 5 refills | Status: AC

## 2018-02-02 NOTE — Progress Notes (Signed)
UDT or UDS (Urine Drug Test or Urine Drug Screen) (AKA - Urine drug screening test) The Test: The urine drug screen (UDS) test used by our facility is a forensic, state-of-the-art, ultra-high performance, liquid chromatography and mass spectrometry system (UPLC/MS-MS), the most sophisticated and accurate method currently available. It is 1,000 times more precise and accurate than standard gas chromatography and mass spectrometry (GC/MS) testing. The system can analyze 26 drug categories and 180 drug compounds. It will detect not only the parent compound but also its break down metabolites. It not only detects the presence of a compound, buts it also provides the amounts, down to nanograms.  The results of this test came back with unexpected findings. (This means that Carol Castro did not disclose the possible presence of the substance, at the time the sample was provided.) Unreported Benzoylecgonine, a metabolite of cocaine; its presence indicates use of this drug. Benzoylecgonine is only found in nature as a metabolite of cocaine, and there would be no other valid reason for its presence in a drug screen.  The findings of this UDT are unacceptable and incompatible with appropriate, safe, and responsible use of controlled substances. This represents non-compliance with the safe use of controlled substances. We will no longer offer this therapy as a treatment option for this patient.

## 2018-10-08 DIAGNOSIS — Z1371 Encounter for nonprocreative screening for genetic disease carrier status: Secondary | ICD-10-CM

## 2018-10-08 DIAGNOSIS — Z8041 Family history of malignant neoplasm of ovary: Secondary | ICD-10-CM

## 2018-10-08 HISTORY — DX: Encounter for nonprocreative screening for genetic disease carrier status: Z13.71

## 2018-10-08 HISTORY — DX: Family history of malignant neoplasm of ovary: Z80.41

## 2018-10-17 ENCOUNTER — Encounter: Payer: Self-pay | Admitting: Obstetrics & Gynecology

## 2018-10-17 ENCOUNTER — Ambulatory Visit (INDEPENDENT_AMBULATORY_CARE_PROVIDER_SITE_OTHER): Payer: Medicaid Other | Admitting: Obstetrics & Gynecology

## 2018-10-17 ENCOUNTER — Other Ambulatory Visit (HOSPITAL_COMMUNITY)
Admission: RE | Admit: 2018-10-17 | Discharge: 2018-10-17 | Disposition: A | Discharge status: Home / Self Care | Payer: Medicaid Other | Source: Ambulatory Visit | Attending: Obstetrics & Gynecology | Admitting: Obstetrics & Gynecology

## 2018-10-17 ENCOUNTER — Other Ambulatory Visit: Payer: Self-pay

## 2018-10-17 VITALS — BP 100/60 | Ht 67.0 in | Wt 148.0 lb

## 2018-10-17 DIAGNOSIS — R87610 Atypical squamous cells of undetermined significance on cytologic smear of cervix (ASC-US): Secondary | ICD-10-CM

## 2018-10-17 DIAGNOSIS — Z113 Encounter for screening for infections with a predominantly sexual mode of transmission: Secondary | ICD-10-CM

## 2018-10-17 DIAGNOSIS — Z01419 Encounter for gynecological examination (general) (routine) without abnormal findings: Secondary | ICD-10-CM

## 2018-10-17 DIAGNOSIS — Z0000 Encounter for general adult medical examination without abnormal findings: Secondary | ICD-10-CM | POA: Diagnosis not present

## 2018-10-17 NOTE — Patient Instructions (Addendum)
PAP every year Labs yearly (with PCP)  Multigene Panel Testing for Cancer  What is cancer? Normal cells in the body grow, divide, and are replaced on a routine basis. Sometimes, cells divide abnormally and begin to grow out of control. These cells may form growths or tumors. Tumors can be benign (not cancer) or malignant (cancer). Benign tumors do not spread to other body tissues. Cancer tumors can invade and destroy nearby healthy tissues, bones, and organs. Cancer cells also can spread to other parts of the body and form new cancerous areas.  What causes cancer? Cancer is caused by many different factors. A few types of cancer are caused by changes in genes that can be passed from parent to child. Changes in genes are called mutations. Certain gene mutations are associated with family cancer syndromes. What are family cancer syndromes?  Family cancer syndromes are genetic conditions that increase the risk of certain types of cancer. They also are called hereditary or inherited cancer syndromes. Common family cancer syndromes include hereditary breast and ovarian cancer (HBOC) syndrome, Lynch syndrome, Li-Fraumeni syndrome, Cowden syndrome, and Peutz-Jeghers syndrome. What is genetic testing for cancer? Genetic testing for cancer looks for mutations in certain genes that are known to be linked to cancer. The results can help determine your risk of developing a disease like cancer or passing on a genetic disorder. What is multigene panel testing? Multigene panel testing is a type of genetic testing that looks for mutations in several genes at once. This is different from single-gene testing, which looks for a mutation in a specific gene. Single-gene testing is often used when there is already a known gene mutation in a family. For example, testing for BRCA mutations only looks for changes in BRCA1 and BRCA2 genes. Who should have genetic testing? You may consider genetic testing if your personal or  family history shows that you have an increased risk of cancer. Your obstetrician-gynecologist (ob-gyn) or other health care professional may ask you these and other questions: . Have you or any family members been diagnosed with cancer?  . If yes, which family members were diagnosed, with what types of cancer, and at what ages?  . Were you or any of your family members born with birth defects?  Carol Castro Are you of [place] or [place] Jewish ancestry? Depending on your answers, your ob-gyn or other health care professional may suggest that you talk about genetic testing with a genetic counselor or a physician who is an expert in genetics. You can choose to have genetic testing, or you can choose not to. Before you decide, you should have genetic counseling. What is genetic counseling? In genetic counseling, you will talk with a genetic counselor or physician expert about the following: . Your risk of getting a hereditary type of cancer  . Who in your family could potentially get tested  . How testing is done  . What the test results may mean  . What you may do depending on the results How is genetic testing done? Genetic testing typically is done from a blood sample or saliva sample. When is multigene panel testing recommended? Multigene panel testing may be useful if you . are at risk of a family cancer syndrome that has more than one gene associated with it  . have a personal or family history of cancer and single-gene testing has not found a mutation, or the result is uncertain What are the benefits of multigene panel testing? Multigene panel testing looks at  multiple genes with one test. If a gene mutation is found, multigene panel testing may . give you a better understanding of your cancer risk than single-gene testing  . help your health care team decide what cancer screenings you might need beyond routine screenings  . help you think about what you can do to prevent cancer What are  the risks of multigene panel testing? The risks of multigene panel testing may include the following: . Results can be complicated to interpret.  . Testing may find gene mutations that show a moderate or uncertain risk of cancer.  . It may be hard to know what you should do with your test results. You should talk with a genetic counselor or physician expert before and after genetic testing to learn what the results mean. If I have a gene mutation, should I tell my family? Having a gene mutation means you can pass the mutation to your children. Your siblings also may have the gene mutation. Although you do not have to tell your family members, sharing the information could be life-saving for them. With this information, your family members can decide whether to be tested and get cancer screenings at an early age. How can I prevent cancer if I test positive for a gene mutation? If you test positive for a gene mutation, you can discuss cancer screening and prevention options with your ob-gyn, genetic counselor, or other health care professional. It may be helpful to have earlier or more frequent cancer screening tests, which can find cancer at an early and more curable stage. Risk reduction steps like medication, surgery, and lifestyle changes also may be recommended. I'm concerned about discrimination based on genetic testing results. What should I know? Many people are concerned about possible employment discrimination or denial of insurance coverage based on genetic testing results. The Genetic Information Nondiscrimination Act of 2008 (GINA) makes it illegal for health insurers to require genetic testing results or use results to make decisions about coverage, rates, or preexisting conditions. GINA also makes it illegal for employers to discriminate against employees or applicants because of genetic information. GINA does not apply to life insurance, long-term care insurance, or disability  insurance. What should I know about direct-to-consumer genetic tests? Direct-to-consumer (or at-home) genetic tests are sold over the internet. You do not need a doctor's order to get one. The [employer] discourages use of direct-to-consumer genetic tests because the results may be misleading. For example, one commercial test for BRCA mutations only looks for three mutations, even though there are more than 500 BRCA mutations linked to cancer. The test results could cause unnecessary fear, or a false sense that you are not at risk. You should see a health care professional if you want a genetic test.  Glossary BRCA1 and BRCA2: Genes that keep cells from growing too rapidly. Changes in these genes have been linked to an increased risk of breast cancer and ovarian cancer.  Cowden Syndrome: A genetic condition that increases a person's risk of cancer of the breast, thyroid, uterus, colon, kidney, and skin. Genes: Segments of DNA that contain instructions for the development of a person's physical traits and control of the processes in the body. The gene is the basic unit of heredity and can be passed from parent to child. Genetic Counselor: A health care professional with special training in genetics who can provide expert advice about genetic disorders and prenatal testing. Hereditary Breast and Ovarian Cancer (HBOC) Syndrome: A genetic  condition that increases a person's risk of cancer of the breast, ovary, prostate, pancreas, and skin (melanoma). Li-Fraumeni Syndrome: A genetic condition that increases a person's risk of cancer of the breast, bones, soft tissue, brain, and outer layer of the adrenal glands. Lynch Syndrome: A genetic condition that increases a person's risk of cancer of the colon, rectum, ovary, uterus, pancreas, and bile duct. Multigene Panel Testing: A type of genetic test that can look for mutations in multiple genes at once. Mutations: Changes  in genes that can be passed from parent to child. Obstetrician-Gynecologist (Ob-Gyn): A doctor with special training and education in women's health. Peutz-Jeghers Syndrome: A genetic condition that increases a person's risk of cancer of the stomach, intestines, pancreas, cervix, ovary, and breast.

## 2018-10-17 NOTE — Progress Notes (Signed)
HPI:      [name] is a 38 y.o. [phone] who LMP was No LMP recorded., she presents today for her annual examination. The patient has no complaints today. The patient is sexually active. Her last pap: approximate date 2019 and was abnormal: ASCUS; normal biopsies. The patient does perform self breast exams.  There is notable family history of breast or ovarian cancer in her family (MAunt Ovarian 29s).  The patient has regular exercise: yes.  The patient denies current symptoms of depression.  Irreg periods, always has been this way.  GYN History: Contraception: tubal ligation  PMHx: Past Medical History:  Diagnosis Date  . Abnormal Pap smear of cervix   . Acquired hypothyroidism 02/16/2014  . Anxiety and depression 07/29/2014  . Complication of anesthesia   . Depression   . Dyspnea 10/07/2015  . Essential hypertension 02/16/2014  . Family history of ovarian cancer    10/19 cancer genetic testing letter sent  . Herniated intervertebral disc of lumbar spine   . History of breast augmentation 2009  . HPV test positive   . Medical history non-contributory   . Nail complaint 10/07/2015  . Vaginal discharge 03/08/2015   Past Surgical History:  Procedure Laterality Date  . BREAST ENHANCEMENT SURGERY  2009  . COSMETIC SURGERY    . LAPAROSCOPIC BILATERAL SALPINGECTOMY Bilateral 08/28/2013   Procedure: LAPAROSCOPIC BILATERAL SALPINGECTOMY;  Surgeon: Lavonia Drafts, MD;  Location: [facility];  Service: Gynecology;  Laterality: Bilateral;  . LEEP  2006   Family History  Problem Relation Age of Onset  . Alcohol abuse Father   . Ovarian cancer Maternal Aunt 48  . Colon cancer Maternal Grandfather 20   Social History   Tobacco Use  . Smoking status: Current Every Day Smoker    Packs/day: 1.00    Types: Cigarettes  . Smokeless tobacco: Never Used  Substance Use Topics  . Alcohol use: No    Alcohol/week: 0.0 standard drinks  . Drug use: No    Current  Outpatient Medications:  .  ADDERALL XR 30 MG 24 hr capsule, , Disp: , Rfl: 0 .  clonazePAM (KLONOPIN) 1 MG tablet, , Disp: , Rfl: 2 .  amphetamine-dextroamphetamine (ADDERALL) 30 MG tablet, , Disp: , Rfl: 0 .  Calcium Carbonate-Vit D-Min (GNP CALCIUM 1200) 1200-1000 MG-UNIT CHEW, Chew 1,200 mg by mouth daily with breakfast. Take in combination with vitamin D and magnesium., Disp: 30 tablet, Rfl: 5 .  cyclobenzaprine (FLEXERIL) 10 MG tablet, Take 10 mg by mouth 3 (three) times daily as needed for muscle spasms., Disp: , Rfl:  .  DULoxetine (CYMBALTA) 60 MG capsule, , Disp: , Rfl:  .  fluticasone (FLONASE) 50 MCG/ACT nasal spray, Place 2 sprays into both nostrils daily. (Patient not taking: Reported on 10/17/2018), Disp: 16 g, Rfl: 5 .  gabapentin (NEURONTIN) 600 MG tablet, Take 600 mg by mouth 3 (three) times daily., Disp: , Rfl:  .  hyoscyamine (LEVSIN SL) 0.125 MG SL tablet, Place under the tongue., Disp: , Rfl:  .  ketorolac (TORADOL) 10 MG tablet, TAKE 1 TABLET BY MOUTH EVERY 4-6 HOURS **MAX 4 TABS/DAY**, Disp: , Rfl: 0 Allergies: Patient has no known allergies.  Review of Systems  Constitutional: Positive for malaise/fatigue. Negative for chills and fever.  HENT: Negative for congestion, sinus pain and sore throat.   Eyes: Negative for blurred vision and pain.  Respiratory: Negative for cough and wheezing.   Cardiovascular: Negative for chest pain and leg swelling.  Gastrointestinal: Negative for abdominal pain, constipation, diarrhea, heartburn, nausea and vomiting.  Genitourinary: Positive for frequency. Negative for dysuria, hematuria and urgency.  Musculoskeletal: Negative for back pain, joint pain, myalgias and neck pain.  Skin: Negative for itching and rash.  Neurological: Negative for dizziness, tremors and weakness.  Endo/Heme/Allergies: Does not bruise/bleed easily.  Psychiatric/Behavioral: Negative for depression. The patient is nervous/anxious. The patient does not have  insomnia.     Objective: BP 100/60   Ht '5\' 7"'  (1.702 m)   Wt 148 lb (67.1 kg)   BMI 23.18 kg/m   Filed Weights   10/17/18 1430  Weight: 148 lb (67.1 kg)   Body mass index is 23.18 kg/m. Physical Exam Constitutional:      General: She is not in acute distress.    Appearance: She is well-developed.  Genitourinary:     Pelvic exam was performed with patient supine.     Vagina, uterus and rectum normal.     No lesions in the vagina.     No vaginal bleeding.     No cervical motion tenderness, friability, lesion or polyp.     Uterus is mobile.     Uterus is not enlarged.     No uterine mass detected.    Uterus is midaxial.     No right or left adnexal mass present.     Right adnexa not tender.     Left adnexa not tender.  HENT:     Head: Normocephalic and atraumatic. No laceration.     Right Ear: Hearing normal.     Left Ear: Hearing normal.     Mouth/Throat:     Pharynx: Uvula midline.  Eyes:     Pupils: Pupils are equal, round, and reactive to light.  Neck:     Musculoskeletal: Normal range of motion and neck supple.     Thyroid: No thyromegaly.  Cardiovascular:     Rate and Rhythm: Normal rate and regular rhythm.     Heart sounds: No murmur. No friction rub. No gallop.   Pulmonary:     Effort: Pulmonary effort is normal. No respiratory distress.     Breath sounds: Normal breath sounds. No wheezing.  Chest:     Breasts:        Right: No mass, skin change or tenderness.        Left: No mass, skin change or tenderness.  Abdominal:     General: Bowel sounds are normal. There is no distension.     Palpations: Abdomen is soft.     Tenderness: There is no abdominal tenderness. There is no rebound.  Musculoskeletal: Normal range of motion.  Neurological:     Mental Status: She is alert and oriented to person, place, and time.     Cranial Nerves: No cranial nerve deficit.  Skin:    General: Skin is warm and dry.  Psychiatric:        Judgment: Judgment normal.   Vitals signs reviewed.     Assessment:  ANNUAL EXAM 1. Women's annual routine gynecological examination   2. Screen for STD (sexually transmitted disease)   3. ASCUS of cervix with negative high risk HPV     Screening Plan:            1.  Cervical Screening-  Pap smear done today Prior ASCUS  2. Breast screening- Exam annually and mammogram>40 planned   3. Labs managed by PCP  4. Counseling for contraception: bilateral tubal ligation   5. Screen  for STD (sexually transmitted disease) - HEP, RPR, HIV Panel   6. She presents with a significant personal and/or family history of ovarian cancer maternal aunt (44s). Details of which can be found in her medical/family history. She does not have a previously identified BRCA and Lynch syndrome mutation in her family. Due to her personal and/or family history of cancer she is a candidate for the [facility] test(s).    Risk for cancer, genetic susceptibility discussed.  Patient has requested gene testing.  Discussed BRCA as well as Lynch syndrome and other cancer risk assessments available based on her family history and personal history. Pros and cons of testing discussed.      F/U  Return in about 1 year (around 10/17/2019) for Annual.  Barnett Applebaum, MD, [facility], [facility] 10/17/2018  2:58 PM

## 2018-10-18 LAB — HEP, RPR, HIV PANEL
HIV Screen 4th Generation wRfx: NONREACTIVE
Hepatitis B Surface Ag: NEGATIVE
RPR Ser Ql: NONREACTIVE

## 2018-10-19 LAB — CYTOLOGY - PAP
Chlamydia: NEGATIVE
Diagnosis: NEGATIVE
Neisseria Gonorrhea: NEGATIVE
Trichomonas: NEGATIVE

## 2018-10-20 NOTE — Progress Notes (Signed)
Left message to advise pt of normal labs and pap

## 2018-10-28 ENCOUNTER — Other Ambulatory Visit: Payer: Self-pay | Admitting: Obstetrics & Gynecology

## 2018-10-28 NOTE — Progress Notes (Signed)
Follow up regarding recent genetic testing due to familial high risk factors.  Her lab genetic panel was negative.  Reassurance provided and counseling as to the pros and cons of testing was done today.  Since the current test is not perfect, it is possible that there may be a gene mutation that current testing cannot detect, but that chance is small. It is possible that a different genetic factor, which has not yet been discovered or is not on this panel, is responsible for the cancer diagnoses in the family. Again, the likelihood of this is low.   Most cancers happen by chance and this test, along with details of her family history, suggests that her cancer falls into this category. She is recommended to follow the cancer screening guidelines provided by her physician.  Breast cancer risk is further calculated based on the T-C model.  Even with negative gene testing, she still has a risk of 5% based on this calculation.  Recommendations are for yearly breast exams, mammograms, and MRI when this risk is > 20%.    Barnett Applebaum, MD, [facility], [facility] 10/28/2018  9:02 AM

## 2018-10-31 ENCOUNTER — Encounter: Payer: Self-pay | Admitting: Obstetrics and Gynecology

## 2018-11-02 NOTE — Telephone Encounter (Signed)
[facility] results hard copy mailed to pt.

## 2019-01-15 ENCOUNTER — Other Ambulatory Visit: Payer: Self-pay

## 2019-01-15 ENCOUNTER — Encounter: Payer: Self-pay | Admitting: Emergency Medicine

## 2019-01-15 ENCOUNTER — Emergency Department
Admission: EM | Admit: 2019-01-15 | Discharge: 2019-01-15 | Disposition: A | Discharge status: Home / Self Care | Payer: Medicaid Other | Attending: Emergency Medicine | Admitting: Emergency Medicine

## 2019-01-15 DIAGNOSIS — Z5321 Procedure and treatment not carried out due to patient leaving prior to being seen by health care provider: Secondary | ICD-10-CM | POA: Diagnosis not present

## 2019-01-15 DIAGNOSIS — R55 Syncope and collapse: Secondary | ICD-10-CM | POA: Insufficient documentation

## 2019-01-15 LAB — BASIC METABOLIC PANEL
Anion gap: 12 (ref 5–15)
BUN: 10 mg/dL (ref 6–20)
CO2: 20 mmol/L — ABNORMAL LOW (ref 22–32)
Calcium: 9.9 mg/dL (ref 8.9–10.3)
Chloride: 106 mmol/L (ref 98–111)
Creatinine, Ser: 0.66 mg/dL (ref 0.44–1.00)
GFR calc Af Amer: >60 mL/min (ref >60)
GFR calc non Af Amer: >60 mL/min (ref >60)
Glucose, Bld: 91 mg/dL (ref 70–99)
Potassium: 3.8 mmol/L (ref 3.5–5.1)
Sodium: 138 mmol/L (ref 135–145)

## 2019-01-15 LAB — URINALYSIS, COMPLETE (UACMP) WITH MICROSCOPIC
Bacteria, UA: NONE SEEN
Bilirubin Urine: NEGATIVE
Glucose, UA: NEGATIVE mg/dL
Hgb urine dipstick: NEGATIVE
Ketones, ur: NEGATIVE mg/dL
Leukocytes,Ua: NEGATIVE
Nitrite: NEGATIVE
Protein, ur: NEGATIVE mg/dL
Specific Gravity, Urine: 1.021 (ref 1.005–1.030)
pH: 6 (ref 5.0–8.0)

## 2019-01-15 LAB — CBC
HCT: 44.2 % (ref 36.0–46.0)
Hemoglobin: 14.4 g/dL (ref 12.0–15.0)
MCH: 29.6 pg (ref 26.0–34.0)
MCHC: 32.6 g/dL (ref 30.0–36.0)
MCV: 90.8 fL (ref 80.0–100.0)
Platelets: 235 10*3/uL (ref 150–400)
RBC: 4.87 MIL/uL (ref 3.87–5.11)
RDW: 12.6 % (ref 11.5–15.5)
WBC: 8.8 10*3/uL (ref 4.0–10.5)
nRBC: 0 % (ref 0.0–0.2)

## 2019-01-15 LAB — GLUCOSE, CAPILLARY: Glucose-Capillary: 88 mg/dL (ref 70–99)

## 2019-01-15 NOTE — ED Triage Notes (Signed)
Pt reports multiple syncopal and near syncopal episodes for several months, pt reports last night she noted to be disoriented, noticed feet swollen, blurry vision. Pt also report she felt like it was hard to breathe.   Pt states sxs persisted for several hours. Pt states she ate and drank all night.  Pt c/o weakness and tired today.

## 2019-01-17 ENCOUNTER — Telehealth: Payer: Self-pay | Admitting: Emergency Medicine

## 2019-01-17 NOTE — Telephone Encounter (Signed)
Called patient due to lwot to inquire about condition and follow up plans. Left message.   

## 2019-01-20 ENCOUNTER — Encounter: Payer: Self-pay | Admitting: *Deleted

## 2019-01-20 ENCOUNTER — Other Ambulatory Visit: Payer: Self-pay

## 2019-01-20 ENCOUNTER — Emergency Department
Admission: EM | Admit: 2019-01-20 | Discharge: 2019-01-21 | Disposition: A | Discharge status: Home / Self Care | Payer: Medicaid Other | Attending: Emergency Medicine | Admitting: Emergency Medicine

## 2019-01-20 DIAGNOSIS — I10 Essential (primary) hypertension: Secondary | ICD-10-CM | POA: Insufficient documentation

## 2019-01-20 DIAGNOSIS — R55 Syncope and collapse: Secondary | ICD-10-CM | POA: Diagnosis present

## 2019-01-20 DIAGNOSIS — Z79899 Other long term (current) drug therapy: Secondary | ICD-10-CM | POA: Insufficient documentation

## 2019-01-20 DIAGNOSIS — F17210 Nicotine dependence, cigarettes, uncomplicated: Secondary | ICD-10-CM | POA: Diagnosis not present

## 2019-01-20 DIAGNOSIS — E119 Type 2 diabetes mellitus without complications: Secondary | ICD-10-CM | POA: Insufficient documentation

## 2019-01-20 LAB — URINE DRUG SCREEN, QUALITATIVE (ARMC ONLY)
Amphetamines, Ur Screen: POSITIVE — AB
Barbiturates, Ur Screen: NOT DETECTED
Benzodiazepine, Ur Scrn: NOT DETECTED
Cannabinoid 50 Ng, Ur ~~LOC~~: NOT DETECTED
Cocaine Metabolite,Ur ~~LOC~~: NOT DETECTED
MDMA (Ecstasy)Ur Screen: NOT DETECTED
Methadone Scn, Ur: NOT DETECTED
Opiate, Ur Screen: NOT DETECTED
Phencyclidine (PCP) Ur S: NOT DETECTED
Tricyclic, Ur Screen: NOT DETECTED

## 2019-01-20 LAB — CBC WITH DIFFERENTIAL/PLATELET
Abs Immature Granulocytes: 0.03 10*3/uL (ref 0.00–0.07)
Basophils Absolute: 0.1 10*3/uL (ref 0.0–0.1)
Basophils Relative: 1 %
Eosinophils Absolute: 0.4 10*3/uL (ref 0.0–0.5)
Eosinophils Relative: 5 %
HCT: 45.4 % (ref 36.0–46.0)
Hemoglobin: 14.7 g/dL (ref 12.0–15.0)
Immature Granulocytes: 0 %
Lymphocytes Relative: 32 %
Lymphs Abs: 2.7 10*3/uL (ref 0.7–4.0)
MCH: 29.2 pg (ref 26.0–34.0)
MCHC: 32.4 g/dL (ref 30.0–36.0)
MCV: 90.1 fL (ref 80.0–100.0)
Monocytes Absolute: 0.8 10*3/uL (ref 0.1–1.0)
Monocytes Relative: 9 %
Neutro Abs: 4.5 10*3/uL (ref 1.7–7.7)
Neutrophils Relative %: 53 %
Platelets: 241 10*3/uL (ref 150–400)
RBC: 5.04 MIL/uL (ref 3.87–5.11)
RDW: 12.2 % (ref 11.5–15.5)
WBC: 8.5 10*3/uL (ref 4.0–10.5)
nRBC: 0 % (ref 0.0–0.2)

## 2019-01-20 LAB — COMPREHENSIVE METABOLIC PANEL
ALT: 11 U/L (ref 0–44)
AST: 12 U/L — ABNORMAL LOW (ref 15–41)
Albumin: 4 g/dL (ref 3.5–5.0)
Alkaline Phosphatase: 47 U/L (ref 38–126)
Anion gap: 9 (ref 5–15)
BUN: 13 mg/dL (ref 6–20)
CO2: 27 mmol/L (ref 22–32)
Calcium: 9.1 mg/dL (ref 8.9–10.3)
Chloride: 102 mmol/L (ref 98–111)
Creatinine, Ser: 0.77 mg/dL (ref 0.44–1.00)
GFR calc Af Amer: >60 mL/min (ref >60)
GFR calc non Af Amer: >60 mL/min (ref >60)
Glucose, Bld: 99 mg/dL (ref 70–99)
Potassium: 4.4 mmol/L (ref 3.5–5.1)
Sodium: 138 mmol/L (ref 135–145)
Total Bilirubin: 0.7 mg/dL (ref 0.3–1.2)
Total Protein: 6.8 g/dL (ref 6.5–8.1)

## 2019-01-20 LAB — URINALYSIS, COMPLETE (UACMP) WITH MICROSCOPIC
Bacteria, UA: NONE SEEN
Bilirubin Urine: NEGATIVE
Glucose, UA: NEGATIVE mg/dL
Ketones, ur: NEGATIVE mg/dL
Leukocytes,Ua: NEGATIVE
Nitrite: NEGATIVE
Protein, ur: NEGATIVE mg/dL
Specific Gravity, Urine: 1.008 (ref 1.005–1.030)
pH: 5 (ref 5.0–8.0)

## 2019-01-20 LAB — POCT PREGNANCY, URINE: Preg Test, Ur: NEGATIVE

## 2019-01-20 LAB — GLUCOSE, CAPILLARY: Glucose-Capillary: 97 mg/dL (ref 70–99)

## 2019-01-20 LAB — ETHANOL: Alcohol, Ethyl (B): <10 mg/dL (ref <10)

## 2019-01-20 LAB — PREGNANCY, URINE: Preg Test, Ur: NEGATIVE

## 2019-01-20 NOTE — ED Triage Notes (Signed)
Pt reporting multiple near syncopal episodes over the past couple months. Pt checked glucose at home and reports it being in the 200s. Pt reports feeling "off" and weak.

## 2019-01-21 LAB — TSH: TSH: 2.239 u[IU]/mL (ref 0.350–4.500)

## 2019-01-21 LAB — HEMOGLOBIN A1C
Hgb A1c MFr Bld: 5.4 % (ref 4.8–5.6)
Mean Plasma Glucose: 108.28 mg/dL

## 2019-01-21 LAB — T4, FREE: Free T4: 1.03 ng/dL (ref 0.61–1.12)

## 2019-01-21 MED ORDER — PROPRANOLOL HCL 20 MG PO TABS: 20.0000 mg | ORAL_TABLET | Freq: Three times a day (TID) | ORAL | 0 refills | Status: DC

## 2019-01-21 NOTE — ED Notes (Signed)
E-signature not working at this time. Pt verbalized understanding of D/C instructions, prescriptions and follow up care with no further questions at this time. Pt in NAD and ambulatory at time of D/C.  

## 2019-01-21 NOTE — ED Provider Notes (Signed)
[facility] Emergency Department Provider Note  ____________________________________________  Time seen: Approximately 2:23 AM  I have reviewed the triage vital signs and the nursing notes.   HISTORY  Chief Complaint Hyperglycemia and Loss of Consciousness    HPI Carol Castro is a 38 y.o. female with a history of anxiety and depression, hypertension who comes the ED complaining of multiple episodes of near syncope over the past several months.  They have started checking her blood sugar at home and found it to be sometimes over 200 immediately after eating.  Never hypoglycemic.  The episodes are comprised of feeling somewhat disoriented and lightheaded which lasted for several hours at a time.  Denies any chest pain shortness of breath vision changes paresthesias weakness headaches or trauma.      Past Medical History:  Diagnosis Date  . Abnormal Pap smear of cervix   . Acquired hypothyroidism 02/16/2014  . Anxiety and depression 07/29/2014  . BRCA negative 10/2018   MyRisk neg; IBIS=10.4%/riskscore=5.7%  . Complication of anesthesia   . Depression   . Dyspnea 10/07/2015  . Essential hypertension 02/16/2014  . Family history of ovarian cancer 10/2018   MyRisk neg  . Herniated intervertebral disc of lumbar spine   . History of breast augmentation 2009  . HPV test positive   . Medical history non-contributory   . Nail complaint 10/07/2015  . Vaginal discharge 03/08/2015     Patient Active Problem List   Diagnosis Date Noted  . Vitamin D insufficiency 02/01/2018  . Dyspareunia due to medical condition in female 11/30/2017  . Irregular menses 11/30/2017  . Weakness of lower extremities (Bilateral) 11/03/2017  . Chronic lumbar radiculopathy 11/03/2017  . Lumbar facet syndrome (Bilateral) (R>L) 11/03/2017  . Chronic hip pain (Secondary Area of Pain) (Bilateral) (R>L) 11/03/2017  . Chronic feet pain [facility] Area of Pain) (Bilateral)  (R>L) 11/03/2017  . Lumbar radiculitis(L5) (Bilateral) (R>L) 11/03/2017  . Coccyodynia 11/03/2017  . 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 Spondylosis without myelopathy or radiculopathy, lumbar region 11/03/2017  . Chronic low back pain (Primary Area of Pain) (Bilateral) (R>L) w/ sciatica (Bilateral) 11/02/2017  . Lumbar central spinal stenosis (L4-5) 11/02/2017  . Abnormal MRI, lumbar spine (10/16/2017) 11/02/2017  . Chronic lower extremity pain (Secondary Area of Pain) (Bilateral) 11/02/2017  . Chronic pain syndrome 11/02/2017  . Long term prescription benzodiazepine use 11/02/2017  . Pharmacologic therapy 11/02/2017  . Disorder of skeletal system 11/02/2017  . Problems influencing health status 11/02/2017  . ASCUS of cervix with negative high risk HPV 09/16/2017  . Dyspnea 10/07/2015  . Nail complaint 10/07/2015  . Attention deficit hyperactivity disorder (ADHD), predominantly inattentive type 05/23/2015  . Vaginal discharge 03/08/2015  . Insomnia 01/16/2015  . Tobacco abuse 10/02/2014  . Anxiety and depression 07/29/2014  . Frequent headaches 07/29/2014  . IBS (irritable bowel syndrome) 07/29/2014  . Acne vulgaris 07/29/2014  . Weight gain 07/29/2014  . Acquired hypothyroidism 02/16/2014  . DM II (diabetes mellitus, type II), controlled ([place]) 02/16/2014  . Essential hypertension 02/16/2014     Past Surgical History:  Procedure Laterality Date  . BREAST ENHANCEMENT SURGERY  2009  . COSMETIC SURGERY    . LAPAROSCOPIC BILATERAL SALPINGECTOMY Bilateral 08/28/2013   Procedure: LAPAROSCOPIC BILATERAL SALPINGECTOMY;  Surgeon: Lavonia Drafts, MD;  Location: [place] [facility];  Service: Gynecology;  Laterality: Bilateral;  . LEEP  2006     Prior to Admission medications   Medication Sig Start Date End Date Taking? Authorizing Provider  ADDERALL XR 30 MG 24 hr capsule  07/28/17   [provider]  amphetamine-dextroamphetamine (ADDERALL) 30 MG  tablet  11/24/17   [provider]  Calcium Carbonate-Vit D-Min (GNP CALCIUM 1200) 1200-1000 MG-UNIT CHEW Chew 1,200 mg by mouth daily with breakfast. Take in combination with vitamin D and magnesium. 02/01/18 07/31/18  Milinda Pointer, MD  clonazePAM Bobbye [place]) 1 MG tablet  07/12/17   [provider]  cyclobenzaprine (FLEXERIL) 10 MG tablet Take 10 mg by mouth 3 (three) times daily as needed for muscle spasms.    [provider]  DULoxetine (CYMBALTA) 60 MG capsule  07/26/18   [provider]  fluticasone (FLONASE) 50 MCG/ACT nasal spray Place 2 sprays into both nostrils daily. Patient not taking: Reported on 10/17/2018 11/28/15   Pleas Koch, NP  gabapentin (NEURONTIN) 600 MG tablet Take 600 mg by mouth 3 (three) times daily.    [provider]  hyoscyamine (LEVSIN SL) 0.125 MG SL tablet Place under the tongue. 08/06/16   [provider]  ketorolac (TORADOL) 10 MG tablet TAKE 1 TABLET BY MOUTH EVERY 4-6 HOURS **MAX 4 TABS/DAY** 11/19/17   [provider]  propranolol (INDERAL) 20 MG tablet Take 1 tablet (20 mg total) by mouth 3 (three) times daily. 01/21/19   Carrie Mew, MD     Allergies Patient has no known allergies.   Family History  Problem Relation Age of Onset  . Alcohol abuse Father   . Ovarian cancer Maternal Aunt 48  . Colon cancer Maternal Grandfather 74    Social History Social History   Tobacco Use  . Smoking status: Current Every Day Smoker    Packs/day: 1.00    Types: Cigarettes  . Smokeless tobacco: Never Used  Substance Use Topics  . Alcohol use: No    Alcohol/week: 0.0 standard drinks  . Drug use: No    Review of Systems  Constitutional:   No fever or chills.  ENT:   No sore throat. No rhinorrhea. Cardiovascular:   No chest pain or syncope.  Positive palpitations Respiratory:   No dyspnea or cough. Gastrointestinal:   Negative for abdominal pain, vomiting and diarrhea.   Musculoskeletal: Chronic intermittent feet swelling All other systems reviewed and are negative except as documented above in ROS and HPI.  ____________________________________________   PHYSICAL EXAM:  VITAL SIGNS: ED Triage Vitals  Enc Vitals Group     BP 01/20/19 1939 (!) 134/99     Pulse Rate 01/20/19 1939 88     Resp 01/20/19 1939 16     Temp 01/20/19 1939 97.9 F (36.6 C)     Temp Source 01/20/19 1939 Oral     SpO2 01/20/19 1939 99 %     Weight 01/20/19 1940 140 lb (63.5 kg)     Height 01/20/19 1940 '5\' 7"'  (1.702 m)     Head Circumference --      Peak Flow --      Pain Score 01/20/19 1947 0     Pain Loc --      Pain Edu? --      Excl. in [place]? --     Vital signs reviewed, nursing assessments reviewed.   Constitutional:   Alert and oriented. Non-toxic appearance. Eyes:   Conjunctivae are normal. EOMI. PERRL. ENT      Head:   Normocephalic and atraumatic.      Nose:   Wearing a mask.      Mouth/Throat:   Wearing a mask.  Neck:   No meningismus. Full ROM.  Thyroid nonpalpable Hematological/Lymphatic/Immunilogical:   No cervical lymphadenopathy. Cardiovascular:   RRR. Symmetric bilateral radial and DP pulses.  No murmurs. Cap refill less than 2 seconds. Respiratory:   Normal respiratory effort without tachypnea/retractions. Breath sounds are clear and equal bilaterally. No wheezes/rales/rhonchi. Gastrointestinal:   Soft and nontender. Non distended. There is no CVA tenderness.  No rebound, rigidity, or guarding. Musculoskeletal:   Normal range of motion in all extremities. No joint effusions.  No lower extremity tenderness.  No edema. Neurologic:   Normal speech and language.  Motor grossly intact. No acute focal neurologic deficits are appreciated.  Skin:    Skin is warm, dry and intact. No rash noted.  No petechiae, purpura, or bullae.  ____________________________________________    LABS (pertinent positives/negatives) (all labs ordered are listed, but  only abnormal results are displayed) Labs Reviewed  COMPREHENSIVE METABOLIC PANEL - Abnormal; Notable for the following components:      Result Value   AST 12 (*)    All other components within normal limits  URINALYSIS, COMPLETE (UACMP) WITH MICROSCOPIC - Abnormal; Notable for the following components:   Color, Urine STRAW (*)    APPearance CLEAR (*)    Hgb urine dipstick SMALL (*)    All other components within normal limits  URINE DRUG SCREEN, QUALITATIVE ([facility] ONLY) - Abnormal; Notable for the following components:   Amphetamines, Ur Screen POSITIVE (*)    All other components within normal limits  GLUCOSE, CAPILLARY  ETHANOL  CBC WITH DIFFERENTIAL/PLATELET  PREGNANCY, URINE  T4, FREE  TSH  HEMOGLOBIN A1C  POCT PREGNANCY, URINE   ____________________________________________   EKG  Interpreted by me Normal sinus rhythm rate of 82, normal axis intervals QRS ST segments and T waves.  ____________________________________________    RADIOLOGY  No results found.  ____________________________________________   PROCEDURES Procedures  ____________________________________________    CLINICAL IMPRESSION / ASSESSMENT AND PLAN / ED COURSE  Medications ordered in the ED: Medications - No data to display  Pertinent labs & imaging results that were available during my care of the patient were reviewed by me and considered in my medical decision making (see chart for details).  Carol Castro was evaluated in Emergency Department on 01/21/2019 for the symptoms described in the history of present illness. She was evaluated in the context of the global COVID-19 pandemic, which necessitated consideration that the patient might be at risk for infection with the SARS-CoV-2 virus that causes COVID-19. Institutional protocols and algorithms that pertain to the evaluation of patients at risk for COVID-19 are in a state of rapid change based on information released by  regulatory bodies including the CDC and federal and state organizations. These policies and algorithms were followed during the patient's care in the ED.   Patient presents with chronic intermittent episodes of near syncope associate with some palpitations.  No pain complaints.  Currently back to normal.  EKG is unremarkable, exam unremarkable.  Labs are all reassuring.  Thyroid studies normal.  Differential currently includes anxiety/panic episodes, paroxysmal dysrhythmia, glucose dysregulation.  A1c added for her to follow-up with primary care on.  Referral to cardiology for consideration of Holter monitor.  Propranolol to take for symptomatic relief for now.  Stable for discharge.  Clinical Course as of Jan 20 225  Sat Jan 20, 9378  0240 T[employer], Ur Screen: Marlynn Perking DETECTED [PS]    Clinical Course User Index [PS] Carrie Mew, MD     ____________________________________________  FINAL CLINICAL IMPRESSION(S) / ED DIAGNOSES    Final diagnoses:  Near syncope     ED Discharge Orders         Ordered    propranolol (INDERAL) 20 MG tablet  3 times daily     01/21/19 0222          Portions of this note were generated with dragon dictation software. Dictation errors may occur despite best attempts at proofreading.   Carrie Mew, MD 01/21/19 [phone]

## 2019-05-23 IMAGING — CR DG FOOT COMPLETE 3+V*R*
1 series · 3 of 3 positions shown · non-contrast
Comparison: None.

CLINICAL DATA: Right foot pain for 1 month, no known injury,
initial encounter

EXAM:
RIGHT FOOT COMPLETE - 3+ VIEW

[Series 1: dg foot complete right · 0.14mm/px · 3 of 3 slices shown]
[im 1/3]
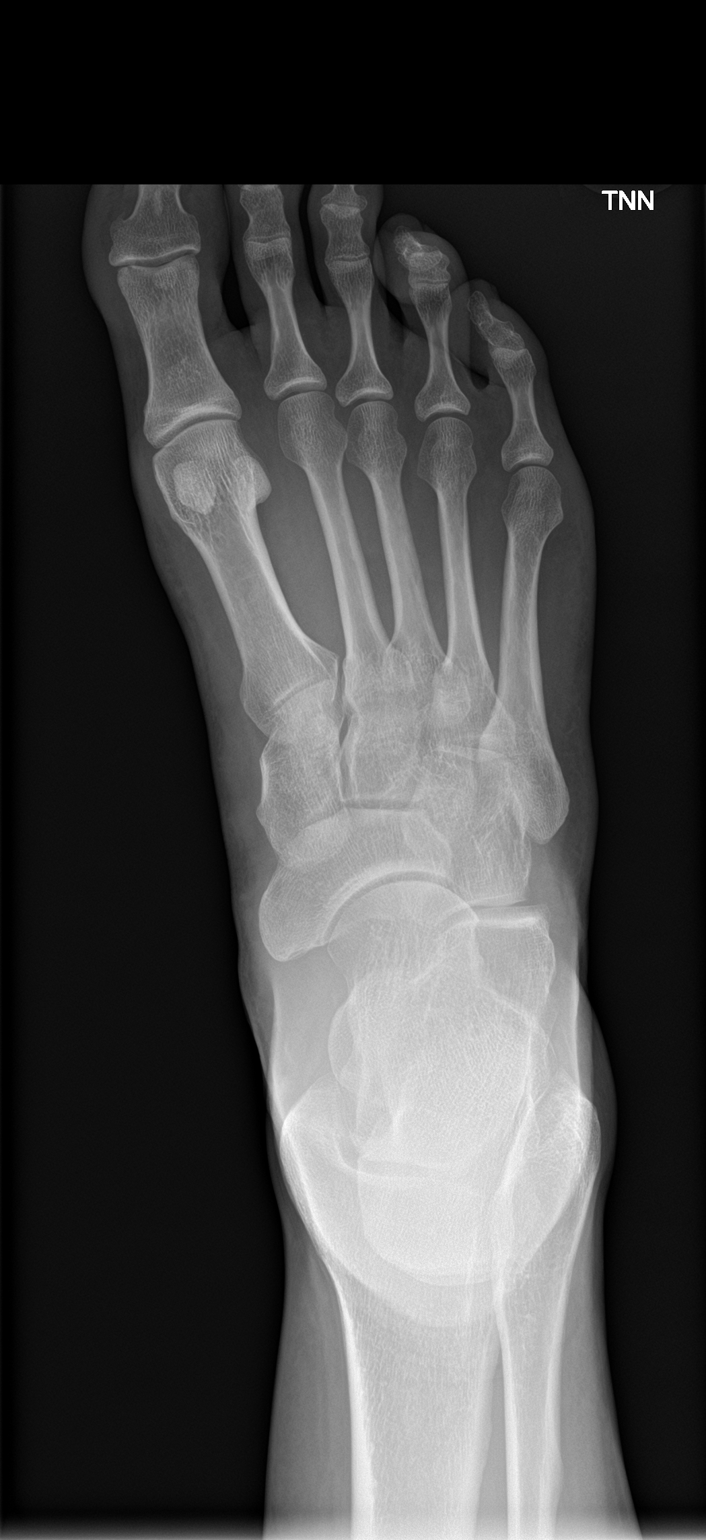
[im 2/3]
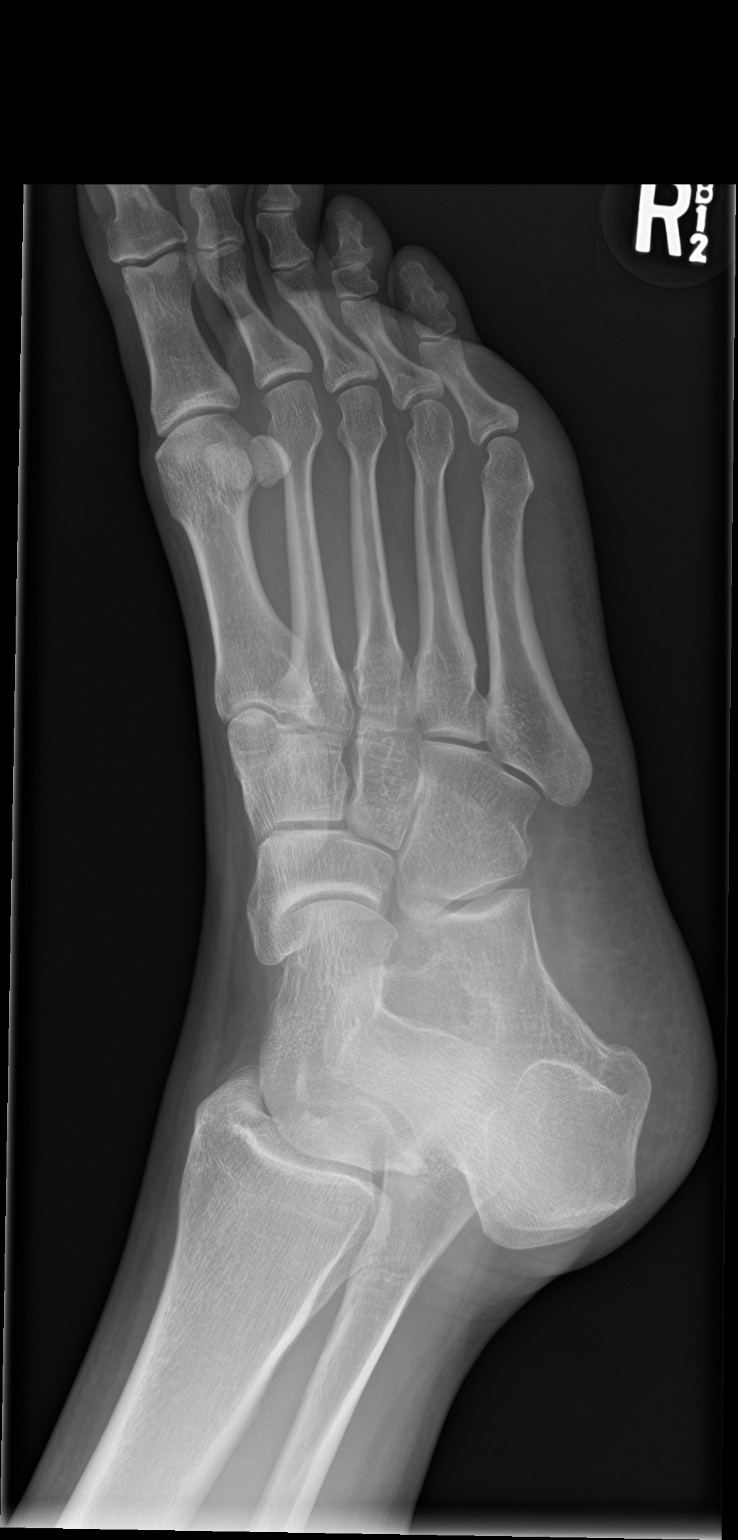
[im 3/3]
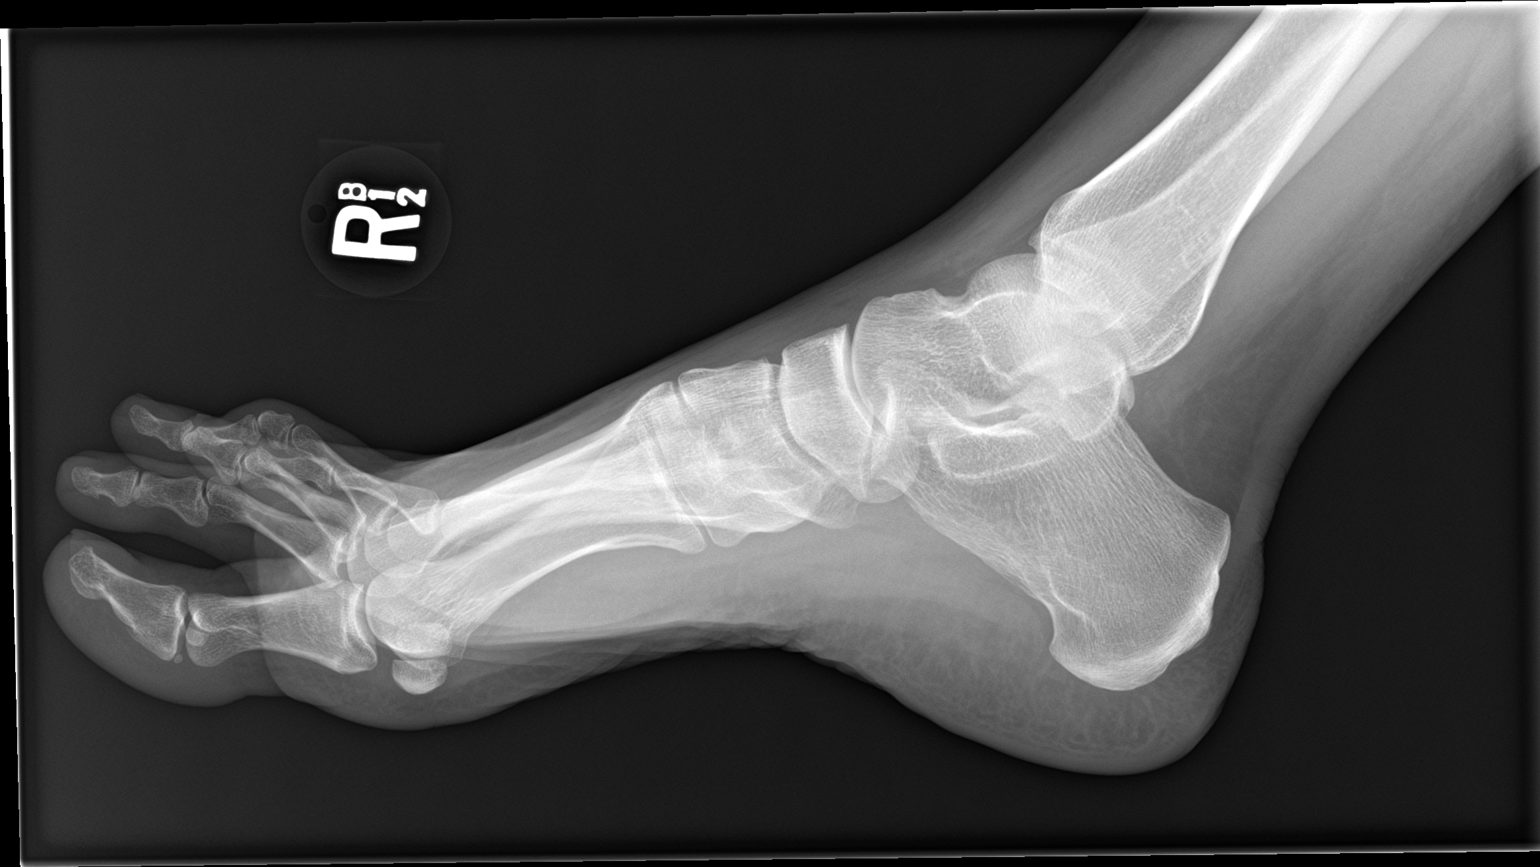

[3 of 3 positions shown; findings below may reference images not displayed]

FINDINGS: There is no evidence of fracture or dislocation. There is no
evidence of arthropathy or other focal bone abnormality. Soft
tissues are unremarkable.
IMPRESSION: No acute abnormality noted.

## 2019-05-23 IMAGING — CR DG FOOT COMPLETE 3+V*L*
1 series · 3 of 3 positions shown · non-contrast
Comparison: None.

CLINICAL DATA: Left foot pain, no known injury, initial encounter

EXAM:
LEFT FOOT - COMPLETE 3+ VIEW

[Series 1: dg foot complete left · 0.14mm/px · 3 of 3 slices shown]
[im 1/3]
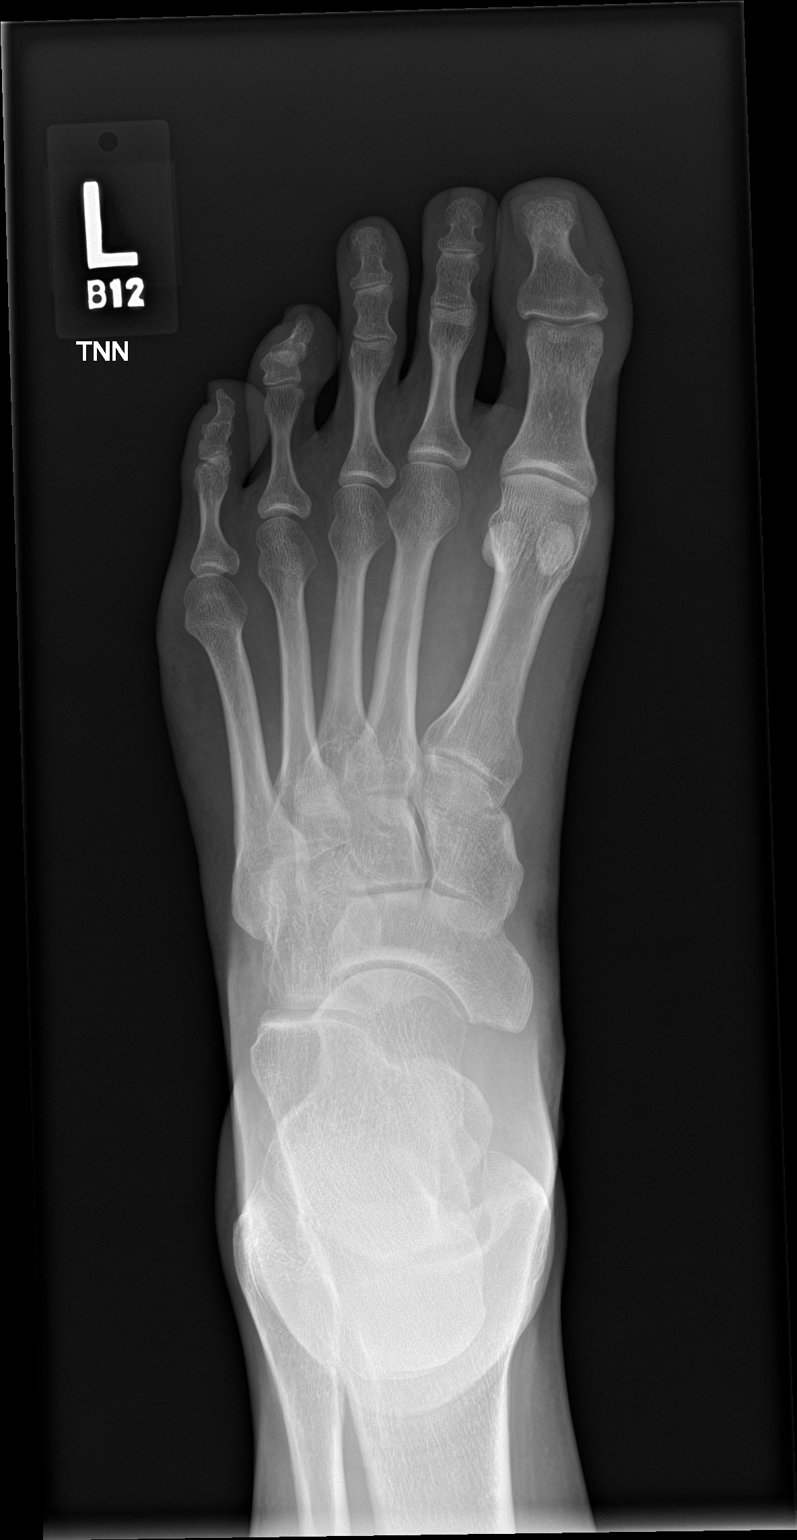
[im 2/3]
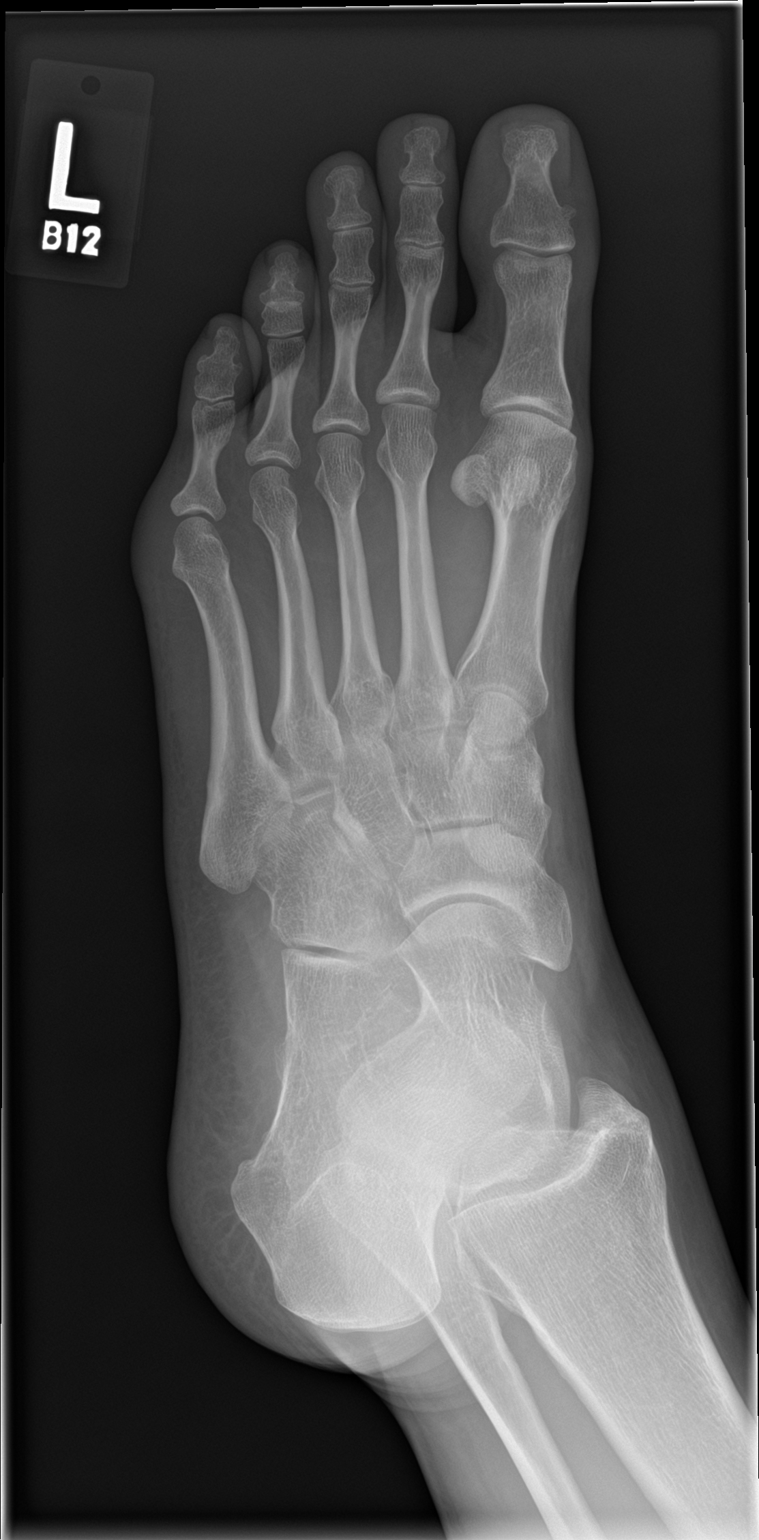
[im 3/3]
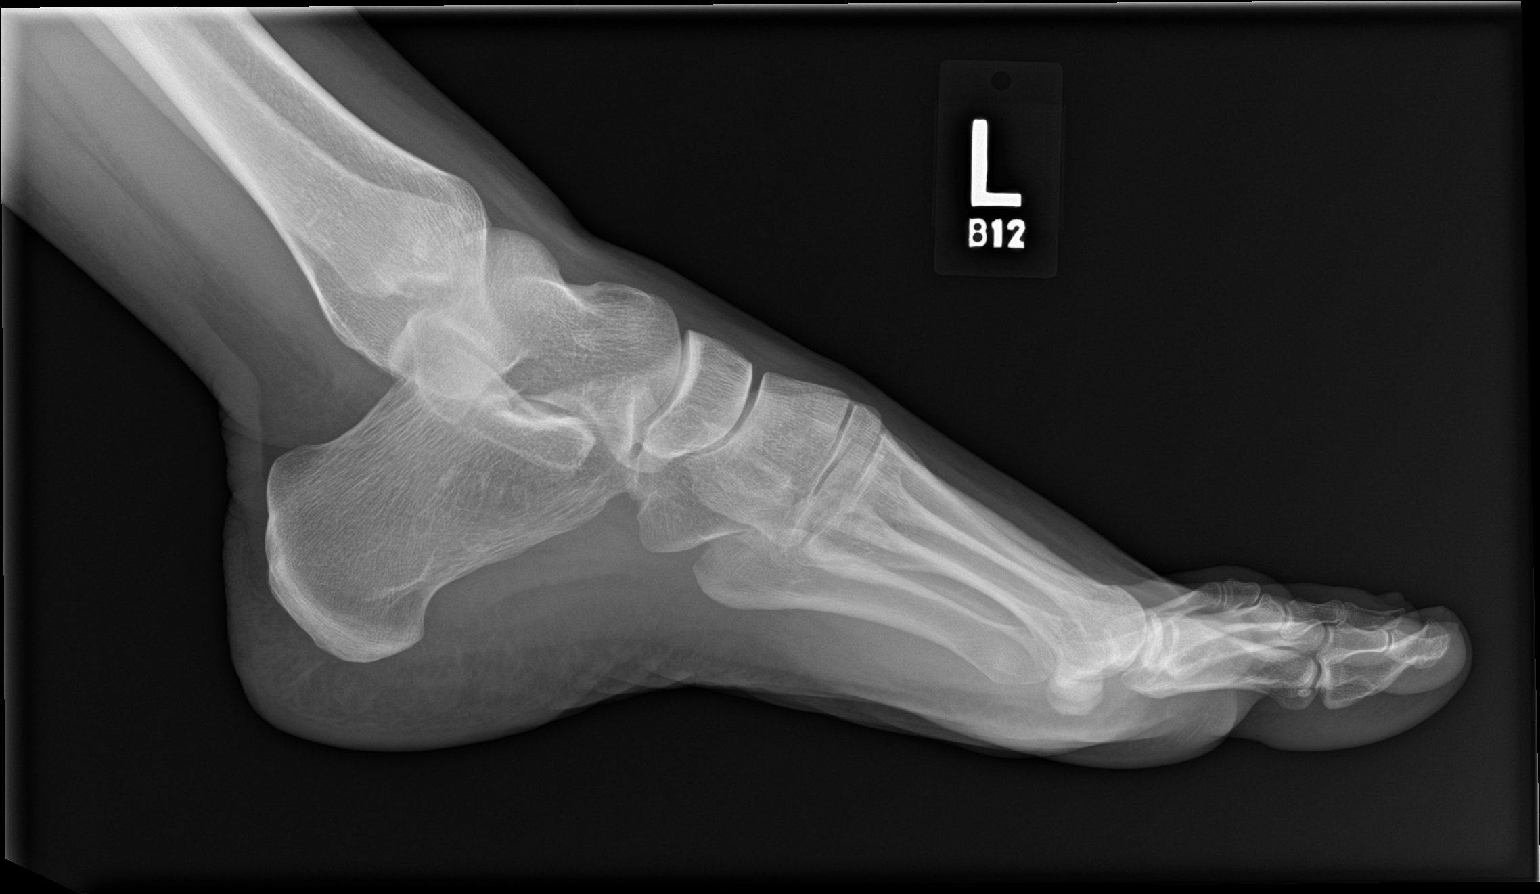

[3 of 3 positions shown; findings below may reference images not displayed]

FINDINGS: There is no evidence of fracture or dislocation. There is no
evidence of arthropathy or other focal bone abnormality. Soft
tissues are unremarkable.
IMPRESSION: No acute abnormality noted.

## 2019-06-13 ENCOUNTER — Ambulatory Visit: Payer: Self-pay | Admitting: Dermatology

## 2019-09-07 DIAGNOSIS — Z419 Encounter for procedure for purposes other than remedying health state, unspecified: Secondary | ICD-10-CM | POA: Diagnosis not present

## 2019-10-08 DIAGNOSIS — Z419 Encounter for procedure for purposes other than remedying health state, unspecified: Secondary | ICD-10-CM | POA: Diagnosis not present

## 2019-11-08 DIAGNOSIS — Z419 Encounter for procedure for purposes other than remedying health state, unspecified: Secondary | ICD-10-CM | POA: Diagnosis not present

## 2019-12-08 DIAGNOSIS — Z419 Encounter for procedure for purposes other than remedying health state, unspecified: Secondary | ICD-10-CM | POA: Diagnosis not present

## 2020-01-08 DIAGNOSIS — Z419 Encounter for procedure for purposes other than remedying health state, unspecified: Secondary | ICD-10-CM | POA: Diagnosis not present

## 2020-02-07 DIAGNOSIS — Z419 Encounter for procedure for purposes other than remedying health state, unspecified: Secondary | ICD-10-CM | POA: Diagnosis not present

## 2020-03-09 DIAGNOSIS — Z419 Encounter for procedure for purposes other than remedying health state, unspecified: Secondary | ICD-10-CM | POA: Diagnosis not present

## 2020-04-09 DIAGNOSIS — Z419 Encounter for procedure for purposes other than remedying health state, unspecified: Secondary | ICD-10-CM | POA: Diagnosis not present

## 2020-05-07 DIAGNOSIS — Z419 Encounter for procedure for purposes other than remedying health state, unspecified: Secondary | ICD-10-CM | POA: Diagnosis not present

## 2020-06-07 DIAGNOSIS — Z419 Encounter for procedure for purposes other than remedying health state, unspecified: Secondary | ICD-10-CM | POA: Diagnosis not present

## 2020-07-07 DIAGNOSIS — Z419 Encounter for procedure for purposes other than remedying health state, unspecified: Secondary | ICD-10-CM | POA: Diagnosis not present

## 2020-08-07 DIAGNOSIS — Z419 Encounter for procedure for purposes other than remedying health state, unspecified: Secondary | ICD-10-CM | POA: Diagnosis not present

## 2020-09-06 DIAGNOSIS — Z419 Encounter for procedure for purposes other than remedying health state, unspecified: Secondary | ICD-10-CM | POA: Diagnosis not present

## 2020-10-07 DIAGNOSIS — Z419 Encounter for procedure for purposes other than remedying health state, unspecified: Secondary | ICD-10-CM | POA: Diagnosis not present

## 2020-11-07 DIAGNOSIS — Z419 Encounter for procedure for purposes other than remedying health state, unspecified: Secondary | ICD-10-CM | POA: Diagnosis not present

## 2020-12-07 DIAGNOSIS — Z419 Encounter for procedure for purposes other than remedying health state, unspecified: Secondary | ICD-10-CM | POA: Diagnosis not present

## 2021-01-07 DIAGNOSIS — Z419 Encounter for procedure for purposes other than remedying health state, unspecified: Secondary | ICD-10-CM | POA: Diagnosis not present

## 2021-02-06 DIAGNOSIS — Z419 Encounter for procedure for purposes other than remedying health state, unspecified: Secondary | ICD-10-CM | POA: Diagnosis not present

## 2021-03-06 DIAGNOSIS — F331 Major depressive disorder, recurrent, moderate: Secondary | ICD-10-CM | POA: Diagnosis not present

## 2021-03-09 DIAGNOSIS — Z419 Encounter for procedure for purposes other than remedying health state, unspecified: Secondary | ICD-10-CM | POA: Diagnosis not present

## 2021-04-09 DIAGNOSIS — Z419 Encounter for procedure for purposes other than remedying health state, unspecified: Secondary | ICD-10-CM | POA: Diagnosis not present

## 2021-04-22 DIAGNOSIS — F902 Attention-deficit hyperactivity disorder, combined type: Secondary | ICD-10-CM | POA: Diagnosis not present

## 2021-04-22 DIAGNOSIS — R69 Illness, unspecified: Secondary | ICD-10-CM | POA: Diagnosis not present

## 2021-04-22 DIAGNOSIS — F411 Generalized anxiety disorder: Secondary | ICD-10-CM | POA: Diagnosis not present

## 2021-04-22 DIAGNOSIS — F331 Major depressive disorder, recurrent, moderate: Secondary | ICD-10-CM | POA: Diagnosis not present

## 2021-05-07 DIAGNOSIS — Z419 Encounter for procedure for purposes other than remedying health state, unspecified: Secondary | ICD-10-CM | POA: Diagnosis not present

## 2021-06-03 DIAGNOSIS — F902 Attention-deficit hyperactivity disorder, combined type: Secondary | ICD-10-CM | POA: Diagnosis not present

## 2021-06-03 DIAGNOSIS — R69 Illness, unspecified: Secondary | ICD-10-CM | POA: Diagnosis not present

## 2021-06-03 DIAGNOSIS — F411 Generalized anxiety disorder: Secondary | ICD-10-CM | POA: Diagnosis not present

## 2021-06-03 DIAGNOSIS — F331 Major depressive disorder, recurrent, moderate: Secondary | ICD-10-CM | POA: Diagnosis not present

## 2021-06-07 DIAGNOSIS — Z419 Encounter for procedure for purposes other than remedying health state, unspecified: Secondary | ICD-10-CM | POA: Diagnosis not present

## 2021-07-07 DIAGNOSIS — Z419 Encounter for procedure for purposes other than remedying health state, unspecified: Secondary | ICD-10-CM | POA: Diagnosis not present

## 2021-08-07 DIAGNOSIS — Z419 Encounter for procedure for purposes other than remedying health state, unspecified: Secondary | ICD-10-CM | POA: Diagnosis not present

## 2021-09-03 DIAGNOSIS — F902 Attention-deficit hyperactivity disorder, combined type: Secondary | ICD-10-CM | POA: Diagnosis not present

## 2021-09-03 DIAGNOSIS — F331 Major depressive disorder, recurrent, moderate: Secondary | ICD-10-CM | POA: Diagnosis not present

## 2021-09-03 DIAGNOSIS — R69 Illness, unspecified: Secondary | ICD-10-CM | POA: Diagnosis not present

## 2021-09-03 DIAGNOSIS — F411 Generalized anxiety disorder: Secondary | ICD-10-CM | POA: Diagnosis not present

## 2021-09-06 DIAGNOSIS — Z419 Encounter for procedure for purposes other than remedying health state, unspecified: Secondary | ICD-10-CM | POA: Diagnosis not present

## 2021-09-25 DIAGNOSIS — Z6825 Body mass index (BMI) 25.0-25.9, adult: Secondary | ICD-10-CM | POA: Diagnosis not present

## 2021-09-25 DIAGNOSIS — L247 Irritant contact dermatitis due to plants, except food: Secondary | ICD-10-CM | POA: Diagnosis not present

## 2021-10-07 DIAGNOSIS — Z419 Encounter for procedure for purposes other than remedying health state, unspecified: Secondary | ICD-10-CM | POA: Diagnosis not present

## 2021-11-07 DIAGNOSIS — Z419 Encounter for procedure for purposes other than remedying health state, unspecified: Secondary | ICD-10-CM | POA: Diagnosis not present

## 2021-11-26 DIAGNOSIS — F411 Generalized anxiety disorder: Secondary | ICD-10-CM | POA: Diagnosis not present

## 2021-11-26 DIAGNOSIS — F902 Attention-deficit hyperactivity disorder, combined type: Secondary | ICD-10-CM | POA: Diagnosis not present

## 2021-11-26 DIAGNOSIS — R69 Illness, unspecified: Secondary | ICD-10-CM | POA: Diagnosis not present

## 2021-11-26 DIAGNOSIS — F331 Major depressive disorder, recurrent, moderate: Secondary | ICD-10-CM | POA: Diagnosis not present

## 2021-12-07 DIAGNOSIS — Z419 Encounter for procedure for purposes other than remedying health state, unspecified: Secondary | ICD-10-CM | POA: Diagnosis not present

## 2021-12-23 DIAGNOSIS — R0602 Shortness of breath: Secondary | ICD-10-CM | POA: Diagnosis not present

## 2021-12-23 DIAGNOSIS — Z6822 Body mass index (BMI) 22.0-22.9, adult: Secondary | ICD-10-CM | POA: Diagnosis not present

## 2021-12-23 DIAGNOSIS — R051 Acute cough: Secondary | ICD-10-CM | POA: Diagnosis not present

## 2021-12-23 DIAGNOSIS — J209 Acute bronchitis, unspecified: Secondary | ICD-10-CM | POA: Diagnosis not present

## 2022-01-07 DIAGNOSIS — Z419 Encounter for procedure for purposes other than remedying health state, unspecified: Secondary | ICD-10-CM | POA: Diagnosis not present

## 2022-01-16 DIAGNOSIS — F411 Generalized anxiety disorder: Secondary | ICD-10-CM | POA: Diagnosis not present

## 2022-01-16 DIAGNOSIS — F331 Major depressive disorder, recurrent, moderate: Secondary | ICD-10-CM | POA: Diagnosis not present

## 2022-01-16 DIAGNOSIS — F902 Attention-deficit hyperactivity disorder, combined type: Secondary | ICD-10-CM | POA: Diagnosis not present

## 2022-01-16 DIAGNOSIS — R69 Illness, unspecified: Secondary | ICD-10-CM | POA: Diagnosis not present

## 2022-02-06 DIAGNOSIS — Z419 Encounter for procedure for purposes other than remedying health state, unspecified: Secondary | ICD-10-CM | POA: Diagnosis not present

## 2022-03-09 DIAGNOSIS — Z419 Encounter for procedure for purposes other than remedying health state, unspecified: Secondary | ICD-10-CM | POA: Diagnosis not present

## 2022-04-09 DIAGNOSIS — Z419 Encounter for procedure for purposes other than remedying health state, unspecified: Secondary | ICD-10-CM | POA: Diagnosis not present

## 2022-04-10 DIAGNOSIS — F902 Attention-deficit hyperactivity disorder, combined type: Secondary | ICD-10-CM | POA: Diagnosis not present

## 2022-04-10 DIAGNOSIS — F411 Generalized anxiety disorder: Secondary | ICD-10-CM | POA: Diagnosis not present

## 2022-04-10 DIAGNOSIS — R69 Illness, unspecified: Secondary | ICD-10-CM | POA: Diagnosis not present

## 2022-04-24 DIAGNOSIS — R69 Illness, unspecified: Secondary | ICD-10-CM | POA: Diagnosis not present

## 2022-04-24 DIAGNOSIS — F902 Attention-deficit hyperactivity disorder, combined type: Secondary | ICD-10-CM | POA: Diagnosis not present

## 2022-04-24 DIAGNOSIS — F331 Major depressive disorder, recurrent, moderate: Secondary | ICD-10-CM | POA: Diagnosis not present

## 2022-04-24 DIAGNOSIS — F411 Generalized anxiety disorder: Secondary | ICD-10-CM | POA: Diagnosis not present

## 2022-05-08 DIAGNOSIS — Z419 Encounter for procedure for purposes other than remedying health state, unspecified: Secondary | ICD-10-CM | POA: Diagnosis not present

## 2022-06-04 DIAGNOSIS — I4581 Long QT syndrome: Secondary | ICD-10-CM | POA: Diagnosis not present

## 2022-06-04 DIAGNOSIS — R52 Pain, unspecified: Secondary | ICD-10-CM | POA: Diagnosis not present

## 2022-06-08 DIAGNOSIS — Z419 Encounter for procedure for purposes other than remedying health state, unspecified: Secondary | ICD-10-CM | POA: Diagnosis not present

## 2022-07-08 DIAGNOSIS — Z419 Encounter for procedure for purposes other than remedying health state, unspecified: Secondary | ICD-10-CM | POA: Diagnosis not present

## 2022-08-08 DIAGNOSIS — Z419 Encounter for procedure for purposes other than remedying health state, unspecified: Secondary | ICD-10-CM | POA: Diagnosis not present

## 2022-12-15 DIAGNOSIS — F411 Generalized anxiety disorder: Secondary | ICD-10-CM | POA: Diagnosis not present

## 2022-12-15 DIAGNOSIS — F902 Attention-deficit hyperactivity disorder, combined type: Secondary | ICD-10-CM | POA: Diagnosis not present

## 2022-12-15 DIAGNOSIS — F331 Major depressive disorder, recurrent, moderate: Secondary | ICD-10-CM | POA: Diagnosis not present

## 2023-03-02 DIAGNOSIS — F4310 Post-traumatic stress disorder, unspecified: Secondary | ICD-10-CM | POA: Diagnosis not present

## 2023-03-16 DIAGNOSIS — F331 Major depressive disorder, recurrent, moderate: Secondary | ICD-10-CM | POA: Diagnosis not present

## 2023-03-16 DIAGNOSIS — F411 Generalized anxiety disorder: Secondary | ICD-10-CM | POA: Diagnosis not present

## 2023-03-16 DIAGNOSIS — F902 Attention-deficit hyperactivity disorder, combined type: Secondary | ICD-10-CM | POA: Diagnosis not present

## 2023-03-16 DIAGNOSIS — F900 Attention-deficit hyperactivity disorder, predominantly inattentive type: Secondary | ICD-10-CM | POA: Diagnosis not present

## 2023-03-19 DIAGNOSIS — F4310 Post-traumatic stress disorder, unspecified: Secondary | ICD-10-CM | POA: Diagnosis not present

## 2023-04-15 DIAGNOSIS — F4310 Post-traumatic stress disorder, unspecified: Secondary | ICD-10-CM | POA: Diagnosis not present

## 2023-04-29 DIAGNOSIS — F4310 Post-traumatic stress disorder, unspecified: Secondary | ICD-10-CM | POA: Diagnosis not present

## 2023-05-13 DIAGNOSIS — F4310 Post-traumatic stress disorder, unspecified: Secondary | ICD-10-CM | POA: Diagnosis not present

## 2023-05-27 DIAGNOSIS — F4310 Post-traumatic stress disorder, unspecified: Secondary | ICD-10-CM | POA: Diagnosis not present

## 2023-06-10 DIAGNOSIS — F4310 Post-traumatic stress disorder, unspecified: Secondary | ICD-10-CM | POA: Diagnosis not present

## 2023-06-24 DIAGNOSIS — F4310 Post-traumatic stress disorder, unspecified: Secondary | ICD-10-CM | POA: Diagnosis not present

## 2023-07-05 DIAGNOSIS — F900 Attention-deficit hyperactivity disorder, predominantly inattentive type: Secondary | ICD-10-CM | POA: Diagnosis not present

## 2023-07-05 DIAGNOSIS — F331 Major depressive disorder, recurrent, moderate: Secondary | ICD-10-CM | POA: Diagnosis not present

## 2023-07-05 DIAGNOSIS — F411 Generalized anxiety disorder: Secondary | ICD-10-CM | POA: Diagnosis not present

## 2023-07-05 DIAGNOSIS — F902 Attention-deficit hyperactivity disorder, combined type: Secondary | ICD-10-CM | POA: Diagnosis not present

## 2023-07-08 DIAGNOSIS — F4310 Post-traumatic stress disorder, unspecified: Secondary | ICD-10-CM | POA: Diagnosis not present

## 2023-08-19 DIAGNOSIS — F4310 Post-traumatic stress disorder, unspecified: Secondary | ICD-10-CM | POA: Diagnosis not present

## 2023-08-26 DIAGNOSIS — F4312 Post-traumatic stress disorder, chronic: Secondary | ICD-10-CM | POA: Insufficient documentation

## 2023-08-26 DIAGNOSIS — J309 Allergic rhinitis, unspecified: Secondary | ICD-10-CM | POA: Insufficient documentation

## 2023-08-26 DIAGNOSIS — F331 Major depressive disorder, recurrent, moderate: Secondary | ICD-10-CM | POA: Insufficient documentation

## 2023-08-26 DIAGNOSIS — H9190 Unspecified hearing loss, unspecified ear: Secondary | ICD-10-CM | POA: Insufficient documentation

## 2023-08-27 ENCOUNTER — Ambulatory Visit (INDEPENDENT_AMBULATORY_CARE_PROVIDER_SITE_OTHER): Admitting: General Practice

## 2023-08-27 ENCOUNTER — Encounter: Payer: Self-pay | Admitting: General Practice

## 2023-08-27 VITALS — BP 120/68 | HR 87 | Temp 99.2°F | Ht 66.15 in | Wt 158.0 lb

## 2023-08-27 DIAGNOSIS — R5383 Other fatigue: Secondary | ICD-10-CM | POA: Insufficient documentation

## 2023-08-27 DIAGNOSIS — Z124 Encounter for screening for malignant neoplasm of cervix: Secondary | ICD-10-CM | POA: Insufficient documentation

## 2023-08-27 DIAGNOSIS — E039 Hypothyroidism, unspecified: Secondary | ICD-10-CM | POA: Diagnosis not present

## 2023-08-27 DIAGNOSIS — E1165 Type 2 diabetes mellitus with hyperglycemia: Secondary | ICD-10-CM | POA: Diagnosis not present

## 2023-08-27 DIAGNOSIS — I10 Essential (primary) hypertension: Secondary | ICD-10-CM

## 2023-08-27 DIAGNOSIS — Z7984 Long term (current) use of oral hypoglycemic drugs: Secondary | ICD-10-CM

## 2023-08-27 DIAGNOSIS — F32A Depression, unspecified: Secondary | ICD-10-CM

## 2023-08-27 DIAGNOSIS — F900 Attention-deficit hyperactivity disorder, predominantly inattentive type: Secondary | ICD-10-CM

## 2023-08-27 DIAGNOSIS — E559 Vitamin D deficiency, unspecified: Secondary | ICD-10-CM | POA: Diagnosis not present

## 2023-08-27 DIAGNOSIS — Z7689 Persons encountering health services in other specified circumstances: Secondary | ICD-10-CM | POA: Insufficient documentation

## 2023-08-27 DIAGNOSIS — F419 Anxiety disorder, unspecified: Secondary | ICD-10-CM | POA: Diagnosis not present

## 2023-08-27 DIAGNOSIS — R232 Flushing: Secondary | ICD-10-CM | POA: Diagnosis not present

## 2023-08-27 NOTE — Assessment & Plan Note (Signed)
 BP at goal today.  Labs pending.

## 2023-08-27 NOTE — Assessment & Plan Note (Signed)
 Controlled.  Following with psychiatry.  Continue treatment per psychiatry.

## 2023-08-27 NOTE — Assessment & Plan Note (Signed)
 Unclear etiology. Differentials include vitamin deficiency, uncontrolled type 2 diabetes, hypothyroidism, hyperlipidemia Labs pending.  Await results.

## 2023-08-27 NOTE — Assessment & Plan Note (Signed)
 Controlled.  Follow with psychiatry.  Continue Adderall 30 mg twice daily per psychiatry.

## 2023-08-27 NOTE — Assessment & Plan Note (Signed)
 EMR reviewed briefly.

## 2023-08-27 NOTE — Assessment & Plan Note (Signed)
 Vitamin D  level pending.  Await results.

## 2023-08-27 NOTE — Patient Instructions (Addendum)
 Stop by the lab prior to leaving today. I will notify you of your results once received.   Start Vitamin D  2000 units once daily.   Follow up in 3 months.   It was a pleasure to meet you today! Please don't hesitate to contact me with any questions. Welcome to [facility]!

## 2023-08-27 NOTE — Assessment & Plan Note (Signed)
 Unclear etiology.  Labs pending. Referral placed for gynecology.

## 2023-08-27 NOTE — Progress Notes (Signed)
 New Patient Office Visit  Subjective    Patient ID: Carol Castro, female    DOB: Oct 22, 1980  Age: 43 y.o. MRN: [id number]  CC:  Chief Complaint  Patient presents with   New Patient (Initial Visit)    HPI Carol Castro is a 43 y.o. female presents to establish care.  [facility]; over a year ago.   ADHD, depression, anxiety, PTSD: [place]  Behavioral care- Adderral XR 30 mg twice daily; Klonopin  1 mg PRN; Hydroxyzine 25 mg as needed. Follows with them every three months ago. Denies SI/HI.   HTN: never been formerly diagnosed per patient. At time, her BP readings were high due to the situation. Denies any headaches, chest pain, shortness of breath.   Type 2 DM: has been on Metformin over 10 years ago.  No recent A1c. She has been monitoring diet and exercise.  She denies any polyuria, polyphagia or polydipsia.  She denies any numbness or tingling.  Fatigue: symptom onset was at least a year or longer. She feels tired all the time and feeling drained all the time. She does get 6-8 hours for 3-4 days a week. She denies any blood in stool. Denies heavy periods. Does endorse a history of low vitamin d .  She has also been experiencing some hot flashes and she is not sure if this is related to perimenopause.  She would like to discuss more with the gynecologist.  She does endorse a history of abnormal Pap smears.  Outpatient Encounter Medications as of 08/27/2023  Medication Sig   ADDERALL XR 30 MG 24 hr capsule    amphetamine -dextroamphetamine  (ADDERALL) 30 MG tablet    clonazePAM  (KLONOPIN ) 1 MG tablet    hydrOXYzine (ATARAX) 25 MG tablet Take 25 mg by mouth 2 (two) times daily as needed.   [DISCONTINUED] diclofenac Sodium (VOLTAREN) 1 % GEL Apply topically.   [DISCONTINUED] Calcium  Carbonate-Vit D-Min (GNP CALCIUM  1200) 1200-1000 MG-UNIT CHEW Chew 1,200 mg by mouth daily with breakfast. Take in combination with vitamin D  and magnesium .    [DISCONTINUED] cyclobenzaprine (FLEXERIL) 10 MG tablet Take 10 mg by mouth 3 (three) times daily as needed for muscle spasms.   [DISCONTINUED] DULoxetine (CYMBALTA) 60 MG capsule    [DISCONTINUED] fluticasone  (FLONASE ) 50 MCG/ACT nasal spray Place 2 sprays into both nostrils daily. (Patient not taking: Reported on 10/17/2018)   [DISCONTINUED] gabapentin (NEURONTIN) 600 MG tablet Take 600 mg by mouth 3 (three) times daily.   [DISCONTINUED] hyoscyamine (LEVSIN SL) 0.125 MG SL tablet Place under the tongue.   [DISCONTINUED] ketorolac  (TORADOL ) 10 MG tablet TAKE 1 TABLET BY MOUTH EVERY 4-6 HOURS **MAX 4 TABS/DAY**   [DISCONTINUED] propranolol  (INDERAL ) 20 MG tablet Take 1 tablet (20 mg total) by mouth 3 (three) times daily.   No facility-administered encounter medications on file as of 08/27/2023.    Past Medical History:  Diagnosis Date   Abnormal Pap smear of cervix    Acquired hypothyroidism 02/16/2014   Anxiety and depression 07/29/2014   BRCA negative 10/2018   MyRisk neg; IBIS=10.4%/riskscore=5.7%   Complication of anesthesia    Depression    Dyspnea 10/07/2015   Essential hypertension 02/16/2014   Family history of ovarian cancer 10/2018   MyRisk neg   Herniated intervertebral disc of lumbar spine    History of breast augmentation 2009   History of chicken pox    HPV test positive    Medical history non-contributory    Nail complaint 10/07/2015   Vaginal discharge 03/08/2015  Past Surgical History:  Procedure Laterality Date   BREAST ENHANCEMENT SURGERY  2009   COSMETIC SURGERY     LAPAROSCOPIC BILATERAL SALPINGECTOMY Bilateral 08/28/2013   Procedure: LAPAROSCOPIC BILATERAL SALPINGECTOMY;  Surgeon: Lenord Radon, MD;  Location: [facility];  Service: Gynecology;  Laterality: Bilateral;   LEEP  2006    Family History  Problem Relation Age of Onset   Stroke Mother    Depression Mother    COPD Mother    Arthritis Mother    Alcohol abuse Father    Asthma Daughter     Ovarian cancer Maternal Aunt 34   Stroke Maternal Grandmother    Diabetes Maternal Grandmother    Asthma Maternal Grandfather    Colon cancer Maternal Grandfather 56    Social History   Socioeconomic History   Marital status: Divorced    Spouse name: Not on file   Number of children: 1   Years of education: Not on file   Highest education level: Bachelor's degree (e.g., BA, AB, BS)  Occupational History   Not on file  Tobacco Use   Smoking status: Every Day    Current packs/day: 1.00    Types: Cigarettes   Smokeless tobacco: Never  Vaping Use   Vaping status: Never Used  Substance and Sexual Activity   Alcohol use: No    Alcohol/week: 0.0 standard drinks of alcohol   Drug use: No   Sexual activity: Not Currently    Birth control/protection: Surgical    Comment: Tubes removed   Other Topics Concern   Not on file  Social History Narrative   Not on file   Social Drivers of Health   Financial Resource Strain: Not on file  Food Insecurity: Not on file  Transportation Needs: Not on file  Physical Activity: Not on file  Stress: Not on file  Social Connections: Unknown (07/22/2021)   [facility]   Social Network    Social Network: Not on file  Intimate Partner Violence: Unknown (06/13/2021)   Received from [facility]   HITS    Physically Hurt: Not on file    Insult or Talk Down To: Not on file    Threaten Physical Harm: Not on file    Scream or Curse: Not on file    Review of Systems  Constitutional:  Positive for malaise/fatigue. Negative for chills and fever.  Respiratory:  Negative for shortness of breath.   Cardiovascular:  Negative for chest pain.  Gastrointestinal:  Negative for abdominal pain, constipation, diarrhea, heartburn, nausea and vomiting.  Genitourinary:  Negative for dysuria, frequency and urgency.  Neurological:  Negative for dizziness and headaches.  Endo/Heme/Allergies:  Negative for polydipsia.  Psychiatric/Behavioral:   Negative for depression and suicidal ideas. The patient is not nervous/anxious.         Objective    BP 120/68   Pulse 87   Temp 99.2 F (37.3 C) (Temporal)   Ht 5' 6.15 (1.68 m)   Wt 158 lb (71.7 kg)   LMP 08/17/2023 (Approximate)   SpO2 97%   BMI 25.39 kg/m   Physical Exam Vitals and nursing note reviewed.  Constitutional:      Appearance: Normal appearance.   Cardiovascular:     Rate and Rhythm: Normal rate and regular rhythm.     Pulses: Normal pulses.     Heart sounds: Normal heart sounds.  Pulmonary:     Effort: Pulmonary effort is normal.     Breath sounds: Normal breath sounds.  Neurological:     Mental Status: She is alert and oriented to person, place, and time.   Psychiatric:        Mood and Affect: Mood normal.        Behavior: Behavior normal.        Thought Content: Thought content normal.        Judgment: Judgment normal.         Assessment & Plan:  Fatigue, unspecified type Assessment & Plan: Unclear etiology. Differentials include vitamin deficiency, uncontrolled type 2 diabetes, hypothyroidism, hyperlipidemia Labs pending.  Await results.  Orders: -     CBC -     Comprehensive metabolic panel with GFR -     Hemoglobin A1c -     TSH -     Vitamin B12 -     VITAMIN D  25 Hydroxy (Vit-D Deficiency, Fractures)  Establishing care with new doctor, encounter for Assessment & Plan: EMR reviewed briefly.    Hot flashes Assessment & Plan: Unclear etiology.  Labs pending. Referral placed for gynecology.   Orders: -     Ambulatory referral to Gynecology  Screening for cervical cancer -     Ambulatory referral to Gynecology  Controlled type 2 diabetes mellitus with hyperglycemia, unspecified whether long term insulin use (HCC) Assessment & Plan: Repeat hemoglobin A1c pending.  Discussed treatment options at length with patient. Await results. Will discuss foot exam, urine ACR, eye exam, pneumonia vaccine at next  visit.  Orders: -     CBC -     Comprehensive metabolic panel with GFR -     Hemoglobin A1c -     TSH  Vitamin D  insufficiency Assessment & Plan: Vitamin D  level pending.  Await results.  Orders: -     VITAMIN D  25 Hydroxy (Vit-D Deficiency, Fractures)  Essential hypertension Assessment & Plan: BP at goal today.  Labs pending.  Orders: -     CBC -     Comprehensive metabolic panel with GFR -     Hemoglobin A1c  Acquired hypothyroidism Assessment & Plan: Repeat TSH pending.  Suspect her symptoms could be related to hypothyroidism patient advised.  Will consider medication once the results come in.  Patient agreeable to treatment plan  Orders: -     TSH  Anxiety and depression Assessment & Plan: Controlled.  Following with psychiatry.  Continue treatment per psychiatry.    Attention deficit hyperactivity disorder (ADHD), predominantly inattentive type Assessment & Plan: Controlled.  Follow with psychiatry.  Continue Adderall 30 mg twice daily per psychiatry.    Return in about 3 months (around 11/27/2023) for DM, HTN, TSH.   Jolanda Nation, NP

## 2023-08-27 NOTE — Assessment & Plan Note (Signed)
 Repeat TSH pending.  Suspect her symptoms could be related to hypothyroidism patient advised.  Will consider medication once the results come in.  Patient agreeable to treatment plan

## 2023-08-27 NOTE — Assessment & Plan Note (Signed)
 Repeat hemoglobin A1c pending.  Discussed treatment options at length with patient. Await results. Will discuss foot exam, urine ACR, eye exam, pneumonia vaccine at next visit.

## 2023-08-28 LAB — VITAMIN B12: Vitamin B-12: 679 pg/mL (ref 200–1100)

## 2023-08-28 LAB — COMPREHENSIVE METABOLIC PANEL WITH GFR
AG Ratio: 2 (calc) (ref 1.0–2.5)
ALT: 8 U/L (ref 6–29)
AST: 10 U/L (ref 10–30)
Albumin: 4.2 g/dL (ref 3.6–5.1)
Alkaline phosphatase (APISO): 44 U/L (ref 31–125)
BUN: 15 mg/dL (ref 7–25)
CO2: 24 mmol/L (ref 20–32)
Calcium: 8.7 mg/dL (ref 8.6–10.2)
Chloride: 105 mmol/L (ref 98–110)
Creat: 0.84 mg/dL (ref 0.50–0.99)
Globulin: 2.1 g/dL (ref 1.9–3.7)
Glucose, Bld: 86 mg/dL (ref 65–99)
Potassium: 4.6 mmol/L (ref 3.5–5.3)
Sodium: 136 mmol/L (ref 135–146)
Total Bilirubin: 0.5 mg/dL (ref 0.2–1.2)
Total Protein: 6.3 g/dL (ref 6.1–8.1)
eGFR: 88 mL/min/{1.73_m2} (ref >=60)

## 2023-08-28 LAB — CBC
HCT: 45.6 % — ABNORMAL HIGH (ref 35.0–45.0)
Hemoglobin: 14.8 g/dL (ref 11.7–15.5)
MCH: 29.6 pg (ref 27.0–33.0)
MCHC: 32.5 g/dL (ref 32.0–36.0)
MCV: 91.2 fL (ref 80.0–100.0)
MPV: 10.9 fL (ref 7.5–12.5)
Platelets: 254 10*3/uL (ref 140–400)
RBC: 5 10*6/uL (ref 3.80–5.10)
RDW: 12.3 % (ref 11.0–15.0)
WBC: 9 10*3/uL (ref 3.8–10.8)

## 2023-08-28 LAB — HEMOGLOBIN A1C
Hgb A1c MFr Bld: 5.5 % (ref <5.7)
Mean Plasma Glucose: 111 mg/dL
eAG (mmol/L): 6.2 mmol/L

## 2023-08-28 LAB — VITAMIN D 25 HYDROXY (VIT D DEFICIENCY, FRACTURES): Vit D, 25-Hydroxy: 33 ng/mL (ref 30–100)

## 2023-08-28 LAB — TSH: TSH: 1.15 m[IU]/L

## 2023-08-30 ENCOUNTER — Ambulatory Visit: Payer: Self-pay | Admitting: General Practice

## 2023-09-02 DIAGNOSIS — F4310 Post-traumatic stress disorder, unspecified: Secondary | ICD-10-CM | POA: Diagnosis not present

## 2023-09-16 DIAGNOSIS — F4310 Post-traumatic stress disorder, unspecified: Secondary | ICD-10-CM | POA: Diagnosis not present

## 2023-09-20 DIAGNOSIS — L237 Allergic contact dermatitis due to plants, except food: Secondary | ICD-10-CM | POA: Diagnosis not present

## 2023-09-20 DIAGNOSIS — Z6823 Body mass index (BMI) 23.0-23.9, adult: Secondary | ICD-10-CM | POA: Diagnosis not present

## 2023-09-30 DIAGNOSIS — F4310 Post-traumatic stress disorder, unspecified: Secondary | ICD-10-CM | POA: Diagnosis not present

## 2023-10-04 DIAGNOSIS — F411 Generalized anxiety disorder: Secondary | ICD-10-CM | POA: Diagnosis not present

## 2023-10-04 DIAGNOSIS — F331 Major depressive disorder, recurrent, moderate: Secondary | ICD-10-CM | POA: Diagnosis not present

## 2023-10-04 DIAGNOSIS — F902 Attention-deficit hyperactivity disorder, combined type: Secondary | ICD-10-CM | POA: Diagnosis not present

## 2023-10-14 DIAGNOSIS — F4310 Post-traumatic stress disorder, unspecified: Secondary | ICD-10-CM | POA: Diagnosis not present

## 2023-11-29 ENCOUNTER — Ambulatory Visit: Admitting: General Practice

## 2023-12-02 ENCOUNTER — Ambulatory Visit: Admitting: General Practice

## 2024-01-03 DIAGNOSIS — F331 Major depressive disorder, recurrent, moderate: Secondary | ICD-10-CM | POA: Diagnosis not present

## 2024-01-03 DIAGNOSIS — F411 Generalized anxiety disorder: Secondary | ICD-10-CM | POA: Diagnosis not present

## 2024-01-03 DIAGNOSIS — F902 Attention-deficit hyperactivity disorder, combined type: Secondary | ICD-10-CM | POA: Diagnosis not present
